# Patient Record
Sex: Male | Born: 1960
Health system: Southern US, Community
[De-identification: ages and names within clinical notes are randomized; demographics above are authoritative.]

## PROBLEM LIST (undated history)

## (undated) DIAGNOSIS — M722 Plantar fascial fibromatosis: Secondary | ICD-10-CM

## (undated) DIAGNOSIS — T8859XA Other complications of anesthesia, initial encounter: Secondary | ICD-10-CM

## (undated) DIAGNOSIS — T4145XA Adverse effect of unspecified anesthetic, initial encounter: Secondary | ICD-10-CM

## (undated) DIAGNOSIS — M1990 Unspecified osteoarthritis, unspecified site: Secondary | ICD-10-CM

## (undated) DIAGNOSIS — T7840XA Allergy, unspecified, initial encounter: Secondary | ICD-10-CM

## (undated) DIAGNOSIS — R112 Nausea with vomiting, unspecified: Secondary | ICD-10-CM

## (undated) DIAGNOSIS — Z9889 Other specified postprocedural states: Secondary | ICD-10-CM

## (undated) DIAGNOSIS — I10 Essential (primary) hypertension: Secondary | ICD-10-CM

## (undated) DIAGNOSIS — Z87442 Personal history of urinary calculi: Secondary | ICD-10-CM

## (undated) DIAGNOSIS — N200 Calculus of kidney: Secondary | ICD-10-CM

## (undated) HISTORY — DX: Allergy, unspecified, initial encounter: T78.40XA

## (undated) HISTORY — DX: Unspecified osteoarthritis, unspecified site: M19.90

## (undated) HISTORY — DX: Essential (primary) hypertension: I10

## (undated) HISTORY — DX: Calculus of kidney: N20.0

## (undated) HISTORY — DX: Plantar fascial fibromatosis: M72.2

---
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
## 1989-05-05 HISTORY — PX: APPENDECTOMY: SHX54

## 1996-05-05 HISTORY — PX: TESTICLE SURGERY: SHX794

## 1996-05-05 HISTORY — PX: VARICOCELECTOMY: SHX1084

## 2000-05-05 HISTORY — PX: TENDON REPAIR: SHX5111

## 2002-05-05 HISTORY — PX: LIPOMA EXCISION: SHX5283

## 2010-05-05 DIAGNOSIS — N200 Calculus of kidney: Secondary | ICD-10-CM

## 2010-05-05 HISTORY — PX: KIDNEY STONE SURGERY: SHX686

## 2010-05-05 HISTORY — DX: Calculus of kidney: N20.0

## 2012-05-05 HISTORY — PX: COLONOSCOPY: SHX174

## 2012-12-14 ENCOUNTER — Encounter: Payer: Self-pay | Admitting: Gastroenterology

## 2012-12-23 ENCOUNTER — Ambulatory Visit (AMBULATORY_SURGERY_CENTER): Payer: BC Managed Care – PPO | Admitting: *Deleted

## 2012-12-23 ENCOUNTER — Telehealth: Payer: Self-pay | Admitting: *Deleted

## 2012-12-23 ENCOUNTER — Encounter: Payer: Self-pay | Admitting: Gastroenterology

## 2012-12-23 VITALS — Ht 67.0 in | Wt 164.8 lb

## 2012-12-23 DIAGNOSIS — Z1211 Encounter for screening for malignant neoplasm of colon: Secondary | ICD-10-CM

## 2012-12-23 NOTE — Telephone Encounter (Signed)
John: Pt says that when he had appendectomy in [place] in 1991, he was given [place] with his anesthesia.  He said he ended up on a ventilator for 8 hours after the surgery because he could not wake up.  He has had surgeries since being in the [place] with no problem.  He is shceduled for colonoscopy with Dr Arlyce Dice on 9/4.  If you want to talk to him; his phone number is:  [phone].  Is pt okay for [facility]?  Thanks, [employer]

## 2012-12-23 NOTE — Progress Notes (Signed)
Pt says that when he had appendectomy in [place] in 1991, he was give [place] with his anesthesia.  He said he ended up on a ventilator for 8 hours after the surgery.  Note will be sent to Cathlyn Parsons to call pt and talk to him about sedation and anesthesia.  Pt given sample of Suprep.

## 2012-12-24 NOTE — Telephone Encounter (Signed)
John Wall  He is OK for [employer],  [employer]

## 2012-12-24 NOTE — Telephone Encounter (Signed)
noted 

## 2013-01-06 ENCOUNTER — Encounter: Payer: Self-pay | Admitting: Gastroenterology

## 2013-01-06 ENCOUNTER — Ambulatory Visit (AMBULATORY_SURGERY_CENTER): Payer: BC Managed Care – PPO | Admitting: Gastroenterology

## 2013-01-06 VITALS — BP 120/75 | HR 56 | Temp 98.3°F | Resp 15 | Ht 67.0 in | Wt 164.0 lb

## 2013-01-06 DIAGNOSIS — Z1211 Encounter for screening for malignant neoplasm of colon: Secondary | ICD-10-CM

## 2013-01-06 MED ORDER — SODIUM CHLORIDE 0.9 % IV SOLN: 500.0000 mL | INTRAVENOUS | Status: DC

## 2013-01-06 NOTE — Patient Instructions (Signed)
YOU HAD AN ENDOSCOPIC PROCEDURE TODAY AT THE [facility]: Refer to the procedure report that was given to you for any specific questions about what was found during the examination.  If the procedure report does not answer your questions, please call your gastroenterologist to clarify.  If you requested that your care partner not be given the details of your procedure findings, then the procedure report has been included in a sealed envelope for you to review at your convenience later.  YOU SHOULD EXPECT: Some feelings of bloating in the abdomen. Passage of more gas than usual.  Walking can help get rid of the air that was put into your GI tract during the procedure and reduce the bloating. If you had a lower endoscopy (such as a colonoscopy or flexible sigmoidoscopy) you may notice spotting of blood in your stool or on the toilet paper. If you underwent a bowel prep for your procedure, then you may not have a normal bowel movement for a few days.  DIET: Your first meal following the procedure should be a light meal and then it is ok to progress to your normal diet.  A half-sandwich or bowl of soup is an example of a good first meal.  Heavy or fried foods are harder to digest and may make you feel nauseous or bloated.  Likewise meals heavy in dairy and vegetables can cause extra gas to form and this can also increase the bloating.  Drink plenty of fluids but you should avoid alcoholic beverages for 24 hours.  ACTIVITY: Your care partner should take you home directly after the procedure.  You should plan to take it easy, moving slowly for the rest of the day.  You can resume normal activity the day after the procedure however you should NOT DRIVE or use heavy machinery for 24 hours (because of the sedation medicines used during the test).    SYMPTOMS TO REPORT IMMEDIATELY: A gastroenterologist can be reached at any hour.  During normal business hours, 8:30 AM to 5:00 PM Monday through Friday,  call [phone].  After hours and on weekends, please call the GI answering service at [phone] who will take a message and have the physician on call contact you.   Following lower endoscopy (colonoscopy or flexible sigmoidoscopy):  Excessive amounts of blood in the stool  Significant tenderness or worsening of abdominal pains  Swelling of the abdomen that is new, acute  Fever of 100F or higher    FOLLOW UP: If any biopsies were taken you will be contacted by phone or by letter within the next 1-3 weeks.  Call your gastroenterologist if you have not heard about the biopsies in 3 weeks.  Our staff will call the home number listed on your records the next business day following your procedure to check on you and address any questions or concerns that you may have at that time regarding the information given to you following your procedure. This is a courtesy call and so if there is no answer at the home number and we have not heard from you through the emergency physician on call, we will assume that you have returned to your regular daily activities without incident.  SIGNATURES/CONFIDENTIALITY: You and/or your care partner have signed paperwork which will be entered into your electronic medical record.  These signatures attest to the fact that that the information above on your After Visit Summary has been reviewed and is understood.  Full responsibility of the confidentiality   of this discharge information lies with you and/or your care-partner.     

## 2013-01-06 NOTE — Op Note (Signed)
[facility] [address] [place], [address]   COLONOSCOPY PROCEDURE REPORT  PATIENT: John Wall, John Wall  MR#: [id number] BIRTHDATE: 1960-11-23 , 51  yrs. old GENDER: Male ENDOSCOPIST: Louis Meckel, MD REFERRED BY: PROCEDURE DATE:  01/06/2013 PROCEDURE:   Colonoscopy, diagnostic First Screening Colonoscopy - Avg.  risk and is 50 yrs.  old or older Yes.  Prior Negative Screening - Now for repeat screening. N/A  History of Adenoma - Now for follow-up colonoscopy & has been > or = to 3 yrs.  N/A ASA CLASS:   Class II INDICATIONS:average risk screening. MEDICATIONS: MAC sedation, administered by CRNA and propofol (Diprivan) 200mg  IV  DESCRIPTION OF PROCEDURE:   After the risks benefits and alternatives of the procedure were thoroughly explained, informed consent was obtained.  A digital rectal exam revealed no abnormalities of the rectum.   The LB CF-H180AL Loaner [id number] endoscope was introduced through the anus and advanced to the cecum, which was identified by both the appendix and ileocecal valve. No adverse events experienced.   The quality of the prep was excellent using Suprep  The instrument was then slowly withdrawn as the colon was fully examined.      COLON FINDINGS: A normal appearing cecum, ileocecal valve, and appendiceal orifice were identified.  The ascending, hepatic flexure, transverse, splenic flexure, descending, sigmoid colon and rectum appeared unremarkable.  No polyps or cancers were seen. Retroflexed views revealed no abnormalities. The time to cecum=1 minutes 08 seconds.  Withdrawal time=6 minutes 04 seconds.  The scope was withdrawn and the procedure completed. COMPLICATIONS: There were no complications.  ENDOSCOPIC IMPRESSION: Normal colon  RECOMMENDATIONS: Continue current colorectal screening recommendations for "routine risk" patients with a repeat colonoscopy in 10 years.   eSigned:  Louis Meckel, MD 01/06/2013 9:30  AM   cc:   PATIENT NAME:  John Wall, John Wall MR#: [id number]

## 2013-01-06 NOTE — Progress Notes (Signed)
Lidocaine-40mg IV prior to Propofol InductionPropofol given over incremental dosages 

## 2013-01-07 ENCOUNTER — Telehealth: Payer: Self-pay | Admitting: *Deleted

## 2013-01-07 NOTE — Telephone Encounter (Signed)
No answer, left message to call if questions or concerns. 

## 2013-02-03 ENCOUNTER — Encounter: Payer: Self-pay | Admitting: Cardiovascular Disease

## 2013-02-03 ENCOUNTER — Ambulatory Visit (INDEPENDENT_AMBULATORY_CARE_PROVIDER_SITE_OTHER): Payer: BC Managed Care – PPO | Admitting: Cardiovascular Disease

## 2013-02-03 VITALS — BP 132/80 | HR 68 | Ht 67.0 in | Wt 167.1 lb

## 2013-02-03 DIAGNOSIS — I10 Essential (primary) hypertension: Secondary | ICD-10-CM | POA: Insufficient documentation

## 2013-02-03 DIAGNOSIS — R011 Cardiac murmur, unspecified: Secondary | ICD-10-CM | POA: Insufficient documentation

## 2013-02-03 DIAGNOSIS — R079 Chest pain, unspecified: Secondary | ICD-10-CM | POA: Insufficient documentation

## 2013-02-03 NOTE — Assessment & Plan Note (Signed)
His cardiac exam is suggestive of mitral valve prolapse and one wonders whether his episode of exertional chest pain could have been related to choroidal rupture and acute MR. Have recommended that he have an echocardiogram.

## 2013-02-03 NOTE — Assessment & Plan Note (Signed)
Well controlled 

## 2013-02-03 NOTE — Patient Instructions (Signed)
Your physician has requested that you have an echocardiogram. Echocardiography is a painless test that uses sound waves to create images of your heart. It provides your doctor with information about the size and shape of your heart and how well your heart's chambers and valves are working. This procedure takes approximately one hour. There are no restrictions for this procedure.  Your physician has requested that you have en exercise stress test.  We will call you with the results of both tests.  Your physician recommends that you schedule a follow-up appointment in: AS NEEDED

## 2013-02-03 NOTE — Progress Notes (Signed)
Patient ID: Wess Baney, male   DOB: 1960/09/25, 52 y.o.   MRN: [id number]     Reason for office visit Exertional chest pain  Mr. Esiah is a physically active 52 year old gentleman who is originally from [place], [place]. He is a habitual runner. About 3 weeks ago he was running the usual track through the woods that he always enjoys. After running for about 20 minutes he felt sudden onset of severe left-sided chest pain. This was severe and made him stop exercising. After resting and then walking at a slow pace for 5-10 minutes the symptoms resolved. He began jogging again and within about 10 minutes the symptoms again recurred in a similar pattern. Again he stopped and the symptoms resolved. The same pattern repeated a third time. He curtailed physical activity for a few days thinking that maybe he had a respiratory infection. He did better for several days but then when he tried to jog again the symptoms recurred. Again he was able to jog for about 20 minutes until chest discomfort onset. He had just had a colonoscopy roughly 2 days before the initial episode. He has her exercise again and the symptoms have not returned although he has noticed that he has a tightness in his left chest if he takes a very deep breath and strains. It almost sounds like the discomfort is not pleuritic. It is clearly not as severe. He denies any problems with dyspnea, palpitations, dizziness, syncope, lower extremity edema, intermittent claudication or erectile dysfunction. His only cardiovascular risk factor it is mild and well treated systemic hypertension. His father has had coronary problems at the age of 57. He has a reasonably good cholesterol profile with a total cholesterol of 183, triglycerides of 178, HDL 43 and LDL of 104. He does not have diabetes mellitus. He does not smoke. His renal function is normal.  Allergies  Allergen Reactions  . Shrimp [Shellfish Allergy] Shortness Of Breath and Swelling  . Sulfa  Antibiotics Rash    Current Outpatient Prescriptions  Medication Sig Dispense Refill  . Alpha-Aminobutyric Acid POWD by Does not apply route 2 (two) times a week.      Marland Kitchen amLODipine (NORVASC) 5 MG tablet Take 5 mg by mouth daily.      . fluticasone (VERAMYST) 27.5 MCG/SPRAY nasal spray Place 2 sprays into the nose daily.      . Multiple Vitamin (MULTIVITAMIN) tablet Take 1 tablet by mouth daily.       No current facility-administered medications for this visit.    Past Medical History  Diagnosis Date  . Hypertension   . Plantar fasciitis     left foot  . Allergy   . Arthritis     hands  . Kidney stone 2012    Past Surgical History  Procedure Laterality Date  . Rhinoplasty  1977  . Appendectomy  1991  . Tendon repair Left 2002    ankle  . Testicle surgery  1998  . Lipoma excision  2004    abdomen  . Kidney stone surgery  2012    Family History  Problem Relation Age of Onset  . Colon cancer Neg Hx    He is married and has 2 children, the oldest is a marine the other is a 54 year old boy History   Social History  . Marital Status: Married    Spouse Name: N/A    Number of Children: N/A  . Years of Education: N/A   Occupational History  . Not on file.  Social History Main Topics  . Smoking status: Never Smoker   . Smokeless tobacco: Never Used  . Alcohol Use: 1.0 oz/week    2 drink(s) per week  . Drug Use: Not on file  . Sexual Activity: Not on file   Other Topics Concern  . Not on file   Social History Narrative  . No narrative on file    Review of systems: The patient specifically denies any dyspnea at rest or with exertion, orthopnea, paroxysmal nocturnal dyspnea, syncope, palpitations, focal neurological deficits, intermittent claudication, lower extremity edema, unexplained weight gain, cough, hemoptysis or wheezing.  The patient also denies abdominal pain, nausea, vomiting, dysphagia, diarrhea, constipation, polyuria, polydipsia, dysuria,  hematuria, frequency, urgency, abnormal bleeding or bruising, fever, chills, unexpected weight changes, mood swings, change in skin or hair texture, change in voice quality, auditory or visual problems, allergic reactions or rashes, new musculoskeletal complaints other than usual "aches and pains".   PHYSICAL EXAM BP 132/80  Pulse 68  Ht 5\' 7"  (1.702 m)  Wt 167 lb 1.6 oz (75.796 kg)  BMI 26.17 kg/m2  General: Alert, oriented x3, no distress; he looks younger than his stated age and appears fitand relatively athletic Head: no evidence of trauma, PERRL, EOMI, no exophtalmos or lid lag, no myxedema, no xanthelasma; normal ears, nose and oropharynx Neck: normal jugular venous pulsations and no hepatojugular reflux; brisk carotid pulses without delay and no carotid bruits Chest: clear to auscultation, no signs of consolidation by percussion or palpation, normal fremitus, symmetrical and full respiratory excursions Cardiovascular: normal position and quality of the apical impulse, regular rhythm, normal first and second heart sounds, no murmurs, rubs or gallops. He has a grade 1-2/6 systolic murmur heard best roughly 4 cm to the left of the left sternal border it disappears with bilateral hand grip. With a Valsalva maneuver a distinct systolic click develops but it is hard to say that the murmur intensifies. Abdomen: no tenderness or distention, no masses by palpation, no abnormal pulsatility or arterial bruits, normal bowel sounds, no hepatosplenomegaly Extremities: no clubbing, cyanosis or edema; 2+ radial, ulnar and brachial pulses bilaterally; 2+ right femoral, posterior tibial and dorsalis pedis pulses; 2+ left femoral, posterior tibial and dorsalis pedis pulses; no subclavian or femoral bruits Neurological: grossly nonfocal   EKG: NSR, normal  Lipid Panel  total cholesterol of 183, triglycerides of 178, HDL 43 and LDL of 104   ASSESSMENT AND PLAN Chest pain The initial episode 3 weeks ago  is strongly suggestive of exertional angina pectoris, but subsequently symptoms have settled into a more pleuritic pattern. His resting echocardiogram is completely normal and he feels better. I've recommended that he have a treadmill stress test. If this is abnormal I would proceed directly to coronary angiography.  Murmur His cardiac exam is suggestive of mitral valve prolapse and one wonders whether his episode of exertional chest pain could have been related to choroidal rupture and acute MR. Have recommended that he have an echocardiogram.  HTN (hypertension) Well-controlled.   Orders Placed This Encounter  Procedures  . EKG 12-Lead  . Exercise tolerance test  . 2D Echocardiogram without contrast   Meds ordered this encounter  Medications  . fluticasone (VERAMYST) 27.5 MCG/SPRAY nasal spray    Sig: Place 2 sprays into the nose daily.  . Alpha-Aminobutyric Acid POWD    Sig: by Does not apply route 2 (two) times a week.    Junious Silk, MD, [facility] [phone] office (  [phone] pager

## 2013-02-03 NOTE — Assessment & Plan Note (Signed)
The initial episode 3 weeks ago is strongly suggestive of exertional angina pectoris, but subsequently symptoms have settled into a more pleuritic pattern. His resting echocardiogram is completely normal and he feels better. I've recommended that he have a treadmill stress test. If this is abnormal I would proceed directly to coronary angiography.

## 2013-02-07 ENCOUNTER — Encounter: Payer: Self-pay | Admitting: Cardiovascular Disease

## 2013-02-10 ENCOUNTER — Encounter (HOSPITAL_COMMUNITY): Payer: BC Managed Care – PPO

## 2013-02-15 ENCOUNTER — Telehealth (HOSPITAL_COMMUNITY): Payer: Self-pay | Admitting: *Deleted

## 2013-02-22 ENCOUNTER — Ambulatory Visit (HOSPITAL_COMMUNITY): Payer: BC Managed Care – PPO

## 2013-07-14 ENCOUNTER — Ambulatory Visit (HOSPITAL_COMMUNITY)
Admission: RE | Admit: 2013-07-14 | Discharge: 2013-07-14 | Disposition: A | Discharge status: Home / Self Care | Payer: BC Managed Care – PPO | Source: Ambulatory Visit | Attending: Chiropractic Medicine | Admitting: Chiropractic Medicine

## 2013-07-14 ENCOUNTER — Other Ambulatory Visit (HOSPITAL_COMMUNITY): Payer: Self-pay | Admitting: Chiropractic Medicine

## 2013-07-14 DIAGNOSIS — M542 Cervicalgia: Secondary | ICD-10-CM | POA: Diagnosis present

## 2013-07-14 DIAGNOSIS — M549 Dorsalgia, unspecified: Secondary | ICD-10-CM | POA: Insufficient documentation

## 2013-07-14 DIAGNOSIS — R52 Pain, unspecified: Secondary | ICD-10-CM

## 2013-08-23 ENCOUNTER — Ambulatory Visit (INDEPENDENT_AMBULATORY_CARE_PROVIDER_SITE_OTHER): Payer: BC Managed Care – PPO | Admitting: Emergency Medicine

## 2013-08-23 VITALS — BP 130/90 | HR 88 | Temp 98.1°F | Resp 16 | Ht 65.5 in | Wt 170.8 lb

## 2013-08-23 DIAGNOSIS — I10 Essential (primary) hypertension: Secondary | ICD-10-CM

## 2013-08-23 LAB — POCT URINALYSIS DIPSTICK
Bilirubin, UA: NEGATIVE
GLUCOSE UA: NEGATIVE
Ketones, UA: NEGATIVE
LEUKOCYTES UA: NEGATIVE
Nitrite, UA: NEGATIVE
PROTEIN UA: NEGATIVE
Spec Grav, UA: 1.02
UROBILINOGEN UA: 0.2
pH, UA: 7

## 2013-08-23 LAB — POCT UA - MICROSCOPIC ONLY
Bacteria, U Microscopic: NEGATIVE
CRYSTALS, UR, HPF, POC: NEGATIVE
Casts, Ur, LPF, POC: NEGATIVE
Epithelial cells, urine per micros: NEGATIVE
WBC, Ur, HPF, POC: NEGATIVE
Yeast, UA: NEGATIVE

## 2013-08-23 MED ORDER — MELOXICAM 15 MG PO TABS: 15.0000 mg | ORAL_TABLET | Freq: Every day | ORAL | Status: DC

## 2013-08-23 MED ORDER — FLUTICASONE FUROATE 27.5 MCG/SPRAY NA SUSP: 2.0000 | Freq: Every day | NASAL | Status: DC

## 2013-08-23 MED ORDER — AMLODIPINE BESYLATE 5 MG PO TABS: 5.0000 mg | ORAL_TABLET | Freq: Every day | ORAL | Status: DC

## 2013-08-23 NOTE — Progress Notes (Signed)
[facility] [address], [place] [address] [phone]  Date:  08/23/2013   Name:  John Wall   DOB:  July 23, 1960   MRN:  [id number]  PCP:  Pcp Not In System    Chief Complaint: Establish Care, Medication Refill and Allergic Rhinitis    History of Present Illness:  John Wall is a 53 y.o. very pleasant male patient who presents with the following:  History of hypertension.  Out of medications past few days.  Non smoker.  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 Denies other complaint or health concern today.  Had colonoscopy last year.    Patient Active Problem List   Diagnosis Date Noted  . HTN (hypertension) 02/03/2013  . Chest pain 02/03/2013  . Murmur 02/03/2013    Past Medical History  Diagnosis Date  . Hypertension   . Plantar fasciitis     left foot  . Allergy   . Arthritis     hands  . Kidney stone 2012    Past Surgical History  Procedure Laterality Date  . Rhinoplasty  1977  . Appendectomy  1991  . Tendon repair Left 2002    ankle  . Testicle surgery  1998  . Lipoma excision  2004    abdomen  . Kidney stone surgery  2012    History  Substance Use Topics  . Smoking status: Never Smoker   . Smokeless tobacco: Never Used  . Alcohol Use: 1.0 oz/week    2 drink(s) per week    Family History  Problem Relation Age of Onset  . Colon cancer Neg Hx   . Heart attack Father     Allergies  Allergen Reactions  . Shrimp [Shellfish Allergy] Shortness Of Breath and Swelling  . Sulfa Antibiotics Rash    Medication list has been reviewed and updated.  Current Outpatient Prescriptions on File Prior to Visit  Medication Sig Dispense Refill  . Alpha-Aminobutyric Acid POWD by Does not apply route 2 (two) times a week.      Marland Kitchen amLODipine (NORVASC) 5 MG tablet Take 5 mg by mouth daily.      . Multiple Vitamin (MULTIVITAMIN) tablet Take 1 tablet by mouth daily.      .  fluticasone (VERAMYST) 27.5 MCG/SPRAY nasal spray Place 2 sprays into the nose daily.       No current facility-administered medications on file prior to visit.    Review of Systems:  As per HPI, otherwise negative.    Physical Examination: Filed Vitals:   08/23/13 1639  BP: 130/90  Pulse: 88  Temp: 98.1 F (36.7 C)  Resp: 16   Filed Vitals:   08/23/13 1639  Height: 5' 5.5" (1.664 m)  Weight: 170 lb 12.8 oz (77.474 kg)   Body mass index is 27.98 kg/(m^2). Ideal Body Weight: Weight in (lb) to have BMI = 25: 152.2  GEN: WDWN, NAD, Non-toxic, A & O x 3 HEENT: Atraumatic, Normocephalic. Neck supple. No masses, No LAD. Ears and Nose: No external deformity. CV: RRR, No M/G/R. No JVD. No thrill. No extra heart sounds. PULM: CTA B, no wheezes, crackles, rhonchi. No retractions. No resp. distress. No accessory muscle use. ABD: S, NT, ND, +BS. No rebound. No HSM. EXTR: No c/c/e NEURO Normal gait.  PSYCH: Normally interactive. Conversant. Not depressed or anxious appearing.  Calm demeanor.    Assessment and Plan: Hypertension Future labs  Signed,  Ellison Carwin,  MD

## 2013-08-23 NOTE — Addendum Note (Signed)
Addended by: Roselee Culver on: 08/23/2013 05:46 PM   Modules accepted: Orders

## 2013-08-23 NOTE — Patient Instructions (Signed)
Hipertensin (Hypertension) Cuando el corazn late [facility] sangre a travs de las arterias. La fuerza que se origina es la presin arterial. Si la presin es demasiado elevada, se denomina hipertensin. El peligro radica en que puede sufrirla y no saberlo. Hipertensin puede significar que su corazn debe trabajar ms intensamente para bombear sangre. Las arterias pueden Insurance risk surveyor o rgidas. [place] extra [place] el riesgo de enfermedades cardacas, ictus y [employer].  La presin arterial est formada por dos nmeros: el nmero mayor sobre el nmero menor, por ejemplo110/70. Se seala "110/72". Los valores ideales son por debajo de 120 para el nmero ms alto (sistlica) y por debajo de 80 para el ms bajo (diastlica). Anote su presin sangunea hoy. Debe prestar mucha atencin a su presin arterial si sufre alguna otra enfermedad como:  Insuficiencia cardaca  Ataques cardiacos previos  Diabetes  Enfermedad renal crnica  Ictus previo  Mltiples factores de riesgo para enfermedades cardacas Para diagnosticar si usted sufre hipertensin arterial, debe medirse la presin mientras encuentra sentado con el brazo elevado a la altura del nivel del corazn. Debe medirse al menos 2 veces. Una nica lectura de presin arterial elevada (especialmente en el servicio de emergencias) no significa que necesita tratamiento. Hay enfermedades en las que la presin arterial es diferente en ambos brazos. Es importante que consulte rpidamente con su mdico para un control. La [employer] de las personas sufren hipertensin esencial, lo que significa que no tiene una causa especfica. Este tipo de hipertensin puede bajarse modificando algunos factores en el estilo de vida como:  Psychologist, forensic.  El consumo de cigarrillos.  La falta de actividad fsica.  Peso excesivo  Consumo de drogas y alcohol.  Consumiendo menos sal La mayora de las personas no tienen sntomas hasta que la hipertensin  ocasiona un dao en el organismo. El tratamiento efectivo puede evitar, Network engineer o reducir ese dao. TRATAMIENTO: El tratamiento para la hipertensin, cuando se ha identificado una causa, est dirigido a la misma Hay un gran nmero en medicamentos para tratarla. Se agrupan en diferentes categoras y Office manager los medicamentos indicados para usted. Muchos medicamentos disponibles tienen efectos secundarios. Debe comentar los efectos secundarios con su mdico. Si la presin arterial permanece elevada despus de modificar su estilo de vida o comenzar a tomar medicamentos:  Los medicamentos deben ser reemplazados  Puede ser necesario evaluar otros problemas.  Debe estar seguro que comprende las indicaciones, que sabe cmo y cundo [employer].  Asegrese de Optometrist un control con su mdico dentro del tiempo indicado (generalmente dentro de las [employer]) para volver a Mudlogger presin arterial y Health visitor los medicamentos prescritos.  Si est tomando ms de un medicamento para la presin arterial, asegrese que sabe cmo y en qu momentos debe tomarlos. Tomar los medicamentos al mismo tiempo puede dar como resultado un gran descenso en la presin arterial. SOLICITE ATENCIN MDICA DE INMEDIATO SI PRESENTA:  Dolor de cabeza intenso, visin borrosa o cambios en la visin, o confusin.  Debilidad o adormecimientos inusuales o sensacin de desmayo.  Dolor de pecho o abdominal intenso, vmitos o problemas para respirar. ASEGRESE QUE:   Comprende estas instrucciones.  Controlar su enfermedad.  Solicitar ayuda inmediatamente si no mejora o si empeora. Document Released: 04/21/2005 Document Revised: 07/14/2011 [facility] Patient Information 2014 [place], [place]. Alergias (Allergies) El profesional que lo asiste le ha diagnosticado que usted padece de [place]. Las [employer] pueden ser ocasionadas por cualquier cosa a la que su organismo es sensible. Pueden  ser  alimentos, medicamentos, polen, sustancias qumicas y casi cualquiera de las cosas que lo rodean en su vida diaria que producen alrgenos. Un alrgeno es todo lo que hace que una sustancia produzca alergia. La herencia es uno de los factores que causa este problema. Esto significa que usted puede sufrir alguna de las alergias que sufrieron sus [place]. Las [employer] a la comida pueden ocurrir a Actuary. Estn entre las ms graves y Engineering geologist en peligro la vida. Algunos de los alimentos que comnmente producen [place] son la [place] de [place], los frutos de mar, los Octa, los frutos secos, el trigo y la soja. SNTOMAS  Hinchazn alrededor de la boca.  Una erupcin roja que produce picazn o urticaria.  Vmitos o diarrea.  Dificultad para respirar. LAS REACCIONES ALRGICAS GRAVES PONEN EN PELIGRO LA VIDA . Esta reaccin se denomina anafilaxis. Puede ocasionar que la boca y la garganta se hinchen y produzca dificultad para respirar y Engineer, manufacturing. En reacciones graves, slo una pequea cantidad del alimento (por ejemplo, aceite de cacahuate en la ensalada) puede producir la muerte en pocos segundos. Las [employer] pueden ocurrir a Actuary. Se denominan as porque generalmente se producen durante la misma estacin todos los aos. Puede ser Neomia Dear reaccin al moho, al polen del csped o al polen de los rboles. Otras causas del problema son los alrgenos que contienen los caros del polvo del hogar, el pelaje de las mascotas y las esporas del moho. Los sntomas consisten en congestin nasal, picazn y secrecin nasal asociada con estornudos, y lagrimeo y [employer] ojos. Tambin puede haber picazn de la boca y los odos. Estos problemas aparecen cuando se entra en contacto con el polen y otros alrgenos. Los alrgenos son las partculas que estn en el aire y a las que el organismo reacciona cuando existe una Automotive engineer. Esto hace que usted libere anticuerpos alrgicos. A travs de  una cadena de eventos, estos finalmente hacen que usted libere histamina en la corriente sangunea. Aunque esto implica una proteccin para su organismo, es lo que le produce disconfort. Ese es el motivo por el que se le han indicado antihistamnicos para sentirse mejor. Si usted no Counselling psychologist cul es el alrgeno que le produjo la reaccin, puede someterse a una prueba de [place] o de piel. Las alergias no pueden curarse pero pueden controlarse con medicamentos. La fiebre de heno es un grupo de trastornos alrgicos estacionales Simplemente se tratan con medicamentos de venta libre como difenhidramina (Benadryl). Tome los medicamentos segn las indicaciones. No consuma alcohol ni conduzca mientras toma este medicamento. Consulte con el profesional que lo asiste o siga las instrucciones de uso para las dosis para nios. Si estos medicamentos no le Merchant navy officer, existen muchos otros nuevos que el profesional que lo asiste puede prescribirle. Podrn utilizarse medicamentos ms fuertes tales como un spray nasal, colirios y corticoides si los primeros medicamentos que prueba no lo [place]. Si todos estos fracasan, puede Chemical engineer otros tratamientos como la inmunoterapia o las inyecciones desensibilizantes. Haga una consulta de seguimiento con el profesional que lo asiste si los problemas continan. Estas alergias estacionales no ponen en peligro la vida. Generalmente se trata de una incomodidad que puede aliviarse con medicamentos. INSTRUCCIONES PARA EL CUIDADO DOMICILIARIO  Si no est seguro de que es lo que le produce la reaccin, Audiological scientist un registro de los alimentos que come y los sntomas que le siguen. Evite los Personal assistant.  Si presenta urticaria o una erupcin  cutnea:  Tome los medicamentos como se le indic.  Puede utilizar un antihistamnico de venta libre (difenhidramina) para la urticaria y Cabin crew, segn sea necesario.  Aplquese compresas sobre la piel o tome  baos de agua fra. Evite los [place] calientes. El calor puede hacer que la urticaria y la picazn empeoren.  Si usted es muy alrgico:  Como consecuencia de un tratamiento para una reaccin grave, puede necesitar ser hospitalizado para recibir un seguimiento intensivo.  Utilice un brazalete o collar de alerta mdico, indicando que usted es Air cabin crew.  Usted y su familia deben aprender a Architectural technologist adrenalina o a Risk manager un kit anafilctico.  Si usted ya ha sufrido una reaccin grave, siempre lleve el kit anafilctico o el EpiPen con usted. Si sufre una reaccin grave, utilice esta medicacin del modo en que se lo indic el profesional que lo asiste. Una falla puede conllevar consecuencias fatales. SOLICITE ATENCIN MDICA SI:  Sospecha que puede sufrir una alergia a algn alimento. Los sntomas generalmente ocurren dentro de los 30 minutos posteriores a haber ingerido el alimento.  Los sntomas persistieron durante 2 das o han empeorado.  Desarrolla nuevos sntomas.  Quiere volver a probar o que su hijo consuma nuevamente un alimento o bebida que usted cree que le causa una reaccin IT consultant. Nunca lo haga si ha sufrido una reaccin anafilctica a ese alimento o a esa bebida con anterioridad. Slo intntelo bajo la supervisin del mdico. SOLICITE ATENCIN MDICA DE INMEDIATO SI:  Presenta dificultad para respirar, jadea o tiene una sensacin de opresin en el pecho o en la garganta.  Tiene la boca hinchada, o presenta urticaria, hinchazn o picazn en todo el cuerpo.  Ha sufrido una reaccin grave que ha respondido a Radiation protection practitioner o al EpiPen. Estas reacciones pueden volver a presentarse cuando haya terminado la medicacin. Estas reacciones deben considerarse como que ponen en peligro la vida. EST SEGURO QUE:   Comprende las instrucciones para el alta mdica.  Controlar su enfermedad.  Solicitar atencin mdica de inmediato segn las  indicaciones. Document Released: 04/21/2005 Document Revised: 08/16/2012 [facility] Patient Information 2014 [place], [place].

## 2013-08-24 NOTE — Progress Notes (Signed)
Left a message for patient to return call for appointment with Dr. Greene 

## 2013-08-27 ENCOUNTER — Other Ambulatory Visit (INDEPENDENT_AMBULATORY_CARE_PROVIDER_SITE_OTHER): Payer: BC Managed Care – PPO | Admitting: *Deleted

## 2013-08-27 DIAGNOSIS — I10 Essential (primary) hypertension: Secondary | ICD-10-CM

## 2013-08-27 LAB — COMPREHENSIVE METABOLIC PANEL
ALBUMIN: 4.4 g/dL (ref 3.5–5.2)
ALT: 32 U/L (ref 0–53)
AST: 29 U/L (ref 0–37)
Alkaline Phosphatase: 75 U/L (ref 39–117)
BUN: 14 mg/dL (ref 6–23)
CO2: 25 mEq/L (ref 19–32)
Calcium: 9 mg/dL (ref 8.4–10.5)
Chloride: 100 mEq/L (ref 96–112)
Creat: 0.78 mg/dL (ref 0.50–1.35)
Glucose, Bld: 94 mg/dL (ref 70–99)
POTASSIUM: 4 meq/L (ref 3.5–5.3)
SODIUM: 138 meq/L (ref 135–145)
TOTAL PROTEIN: 6.8 g/dL (ref 6.0–8.3)
Total Bilirubin: 0.8 mg/dL (ref 0.2–1.2)

## 2013-08-27 LAB — LIPID PANEL
Cholesterol: 190 mg/dL (ref 0–200)
HDL: 38 mg/dL — ABNORMAL LOW (ref >39)
LDL CALC: 126 mg/dL — AB (ref 0–99)
Total CHOL/HDL Ratio: 5 Ratio
Triglycerides: 129 mg/dL (ref <150)
VLDL: 26 mg/dL (ref 0–40)

## 2013-08-27 LAB — CBC WITH DIFFERENTIAL/PLATELET
BASOS ABS: 0 10*3/uL (ref 0.0–0.1)
BASOS PCT: 0 % (ref 0–1)
Eosinophils Absolute: 0.4 10*3/uL (ref 0.0–0.7)
Eosinophils Relative: 6 % — ABNORMAL HIGH (ref 0–5)
HCT: 46.6 % (ref 39.0–52.0)
Hemoglobin: 16.4 g/dL (ref 13.0–17.0)
LYMPHS PCT: 20 % (ref 12–46)
Lymphs Abs: 1.4 10*3/uL (ref 0.7–4.0)
MCH: 30.4 pg (ref 26.0–34.0)
MCHC: 35.2 g/dL (ref 30.0–36.0)
MCV: 86.5 fL (ref 78.0–100.0)
Monocytes Absolute: 0.4 10*3/uL (ref 0.1–1.0)
Monocytes Relative: 6 % (ref 3–12)
NEUTROS ABS: 4.8 10*3/uL (ref 1.7–7.7)
Neutrophils Relative %: 68 % (ref 43–77)
Platelets: 303 10*3/uL (ref 150–400)
RBC: 5.39 MIL/uL (ref 4.22–5.81)
RDW: 14.1 % (ref 11.5–15.5)
WBC: 7 10*3/uL (ref 4.0–10.5)

## 2013-08-27 LAB — TSH: TSH: 1.437 u[IU]/mL (ref 0.350–4.500)

## 2013-08-29 LAB — PSA: PSA: 0.64 ng/mL (ref <=4.00)

## 2013-09-07 NOTE — Progress Notes (Signed)
Left another message for patient.

## 2013-09-14 ENCOUNTER — Ambulatory Visit (INDEPENDENT_AMBULATORY_CARE_PROVIDER_SITE_OTHER): Payer: BC Managed Care – PPO | Admitting: Family Medicine

## 2013-09-14 VITALS — BP 140/80 | HR 75 | Temp 98.3°F | Resp 18 | Ht 66.0 in | Wt 170.0 lb

## 2013-09-14 DIAGNOSIS — M1990 Unspecified osteoarthritis, unspecified site: Secondary | ICD-10-CM

## 2013-09-14 DIAGNOSIS — M5430 Sciatica, unspecified side: Secondary | ICD-10-CM

## 2013-09-14 MED ORDER — DICLOFENAC SODIUM 1 % TD GEL: 2.0000 g | Freq: Three times a day (TID) | TRANSDERMAL | Status: DC

## 2013-09-14 MED ORDER — HYDROCODONE-ACETAMINOPHEN 5-325 MG PO TABS
1.0000 | ORAL_TABLET | Freq: Every evening | ORAL | Status: DC | PRN
Start: 2013-09-14 — End: 2014-07-08

## 2013-09-14 MED ORDER — METHYLPREDNISOLONE ACETATE 80 MG/ML IJ SUSP
120.0000 mg | Freq: Once | INTRAMUSCULAR | Status: AC
  Administered 2013-09-14: 120 mg via INTRAMUSCULAR

## 2013-09-14 NOTE — Patient Instructions (Signed)
Ciática  °(Sciatica) ° La ciática es el dolor, debilidad, entumecimiento u hormigueo a lo largo del nervio ciático. El nervio comienza en la zona inferior de la espalda y desciende por la parte posterior de cada pierna. El nervio controla los músculos de la parte inferior de la pierna y de la zona posterior de la rodilla, y transmite la sensibilidad a la parte posterior del muslo, la pierna y la planta del pie. La ciática es un síntoma de otras afecciones médicas. Por ejemplo, un daño a los nervios o algunas enfermedades como un disco herniado o un espolón óseo en la columna vertebral, podrían dañarle o presionar en el nervio ciático. Esto causa dolor, debilidad y otras sensaciones normalmente asociadas con la ciática. Generalmente la ciática afecta sólo un lado del cuerpo. °CAUSAS  °· Disco herniado o desplazado. °· Enfermedad degenerativa del disco. °· Un síndrome doloroso que compromete un músculo angosto de los glúteos (síndrome piriforme). °· Lesión o fractura pélvica. °· Embarazo. °· Tumor (casos raros). °SÍNTOMAS  °Los síntomas pueden variar de leves a muy graves. Por lo general, los síntomas descienden desde la zona lumbar a las nalgas y la parte posterior de la pierna. Ellos son:  °· Hormigueo leve o dolor sordo en la parte inferior de la espalda, la pierna o la cadera. °· Adormecimiento en la parte posterior de la pantorrilla o la planta del pie. °· Sensación de quemazón en la zona lumbar, la pierna o la cadera. °· Dolor agudo en la zona inferior de la espalda, la pierna o la cadera. °· Debilidad en las piernas. °· Dolor de espalda intenso que inhibe los movimientos. °Los síntomas pueden empeorar al toser, estornudar, reír o estar sentado o parado durante mucho tiempo. Además, el sobrepeso puede empeorar los síntomas.  °DIAGNÓSTICO  °Su médico le hará un examen físico para buscar los síntomas comunes de la ciática. Le pedirá que haga algunos movimientos o actividades que activarían el dolor del nervio  ciático. Para encontrar las causas de la ciática podrá indicarle otros estudios. Estos pueden ser:  °· Análisis de sangre. °· Radiografías. °· Pruebas de diagnóstico por imágenes, como resonancia magnética o tomografía computada. °TRATAMIENTO  °El tratamiento se dirige a las causas de la ciática. A veces, el tratamiento no es necesario, y el dolor y el malestar desaparecen por sí mismos. Si necesita tratamiento, su médico puede sugerir:  °· Medicamentos de venta libre para aliviar el dolor. °· Medicamentos recetados, como antiinflamatorios, relajantes musculares o narcóticos. °· Aplicación de calor o hielo en la zona del dolor. °· Inyecciones de corticoides para disminuir el dolor, la irritación y la inflamación alrededor del nervio. °· Reducción de la actividad en los períodos de dolor. °· Ejercicios y estiramiento del abdomen para fortalecer y mejorar la flexibilidad de la columna vertebral. Su médico puede sugerirle perder peso si el peso extra empeora el dolor de espalda. °· Fisioterapia. °· La cirugía para eliminar lo que presiona o pincha el nervio, como un espolón óseo o parte de una hernia de disco. °INSTRUCCIONES PARA EL CUIDADO EN EL HOGAR  °· Sólo tome medicamentos de venta libre o recetados para calmar el dolor o el malestar, según las indicaciones de su médico. °· Aplique hielo sobre el área dolorida durante 20 minutos 3-4 veces por día durante los primeras 48-72 horas. Luego intente aplicar calor de la misma manera. °· Haga ejercicios, elongue o realice sus actividades habituales, si no le causan más dolor. °· Cumpla con todas las sesiones de fisioterapia, según le   primeras 48-72 horas. Luego intente aplicar calor de la misma manera.  · Haga ejercicios, elongue o realice sus actividades habituales, si no le causan más dolor.  · Cumpla con todas las sesiones de fisioterapia, según le indique su médico.  · Cumpla con todas las visitas de control, según le indique su médico.  · No use tacones altos o zapatos que no tengan buen apoyo.  · Verifique que el colchón no sea muy blando. Un colchón firme aliviará el dolor y las molestias.  SOLICITE ATENCIÓN MÉDICA DE INMEDIATO SI:   · Pierde el control de la vejiga o del intestino  (incontinencia).  · Aumenta la debilidad en la zona inferior de la espalda, la pelvis, las nalgas o las piernas.  · Siente irritación o inflamación en la espalda.  · Tiene sensación de ardor al orinar.  · El dolor empeora cuando se acuesta o lo despierta por la noche.  · El dolor es peor del que experimentó en el pasado.  · Dura más de 4 semanas.  · Pierde peso sin motivo de manera súbita.  ASEGÚRESE DE QUE:   · Comprende estas instrucciones.  · Controlará su enfermedad.  · Solicitará ayuda de inmediato si no mejora o si empeora.  Document Released: 04/21/2005 Document Revised: 10/21/2011  ExitCare® Patient Information ©2014 [employer].

## 2013-09-14 NOTE — Progress Notes (Signed)
This chart was scribed for Robyn Haber, MD by Vernell Barrier, Medical Scribe. This patient's care was started at 7:00 PM  This is a 53 y.o.male who complains of low back pain onset 3 day ago. Pt states he was cleaning bent over all day 3 days ago and went to stand up and upon attempting to stand up, an acute pain in the lower back presented with radiation into the right gluteal. Able to ambulate but with pain. Has tried ibuprofen for pain with little relief.   Character of pain: aching Location of pain:  Low back Radiation of pain:  Right gluteal area Onset associated with: Heavy lifting Patient has not had a past history of low back pain.  Kingsten Enfield denies any urinary symptoms, bowel problems, numbness in the legs, loss of motor power. [place] had no fever.  [place] Keziah has tried ibuprofen.  Also presents with pain and swelling in the right fingers and right ulnar wrist which he has been told is osteoarthritis. Seen specialist before and got injection which he states provided some relief but did not sustain. Cannot take Meloxicam because it upsets stomach.   Past Medical History  Diagnosis Date   Hypertension    Plantar fasciitis     left foot   Allergy    Arthritis     hands   Kidney stone 2012     Past Surgical History  Procedure Laterality Date   Rhinoplasty  1977   Appendectomy  1991   Tendon repair Left 2002    ankle   Testicle surgery  1998   Lipoma excision  2004    abdomen   Kidney stone surgery  2012    Objective:  middle-aged male in no acute distress. Blood pressure 140/80, pulse 75, temperature 98.3 F (36.8 C), temperature source Oral, resp. rate 18, height 5\' 6"  (1.676 m), weight 170 lb (77.111 kg), SpO2 99.00%.Body mass index is 27.45 kg/(m^2). Palpation of the back reveals nontender CVA:  nontender Abdomen: soft, nontender Peripheral pulses:  DP/PT 2+ Inspection of the back: Reveals no scoliosis Straight-leg raising: pain with  right elevation Motor exam of lower extremity: No abnormal weakness. Reflexes: Symmetric and normal Skin exam: normal  Assessment/Plan: Acute lower back pain without acute neurological findings.  Sciatic neuralgia - Plan: HYDROcodone-acetaminophen (NORCO) 5-325 MG per tablet, methylPREDNISolone acetate (DEPO-MEDROL) injection 120 mg  Osteoarthritis - Plan: diclofenac sodium (VOLTAREN) 1 % GEL, methylPREDNISolone acetate (DEPO-MEDROL) injection 120 mg  Signed, Robyn Haber, MD

## 2013-09-16 ENCOUNTER — Telehealth: Payer: Self-pay

## 2013-09-16 NOTE — Telephone Encounter (Signed)
Pt called in regards to prescription Voltaren, pharmacy is still waiting on authorization.

## 2013-09-16 NOTE — Telephone Encounter (Signed)
I have not gotten any fax about needing a PA. Called and asked pharmacy to send PA info.

## 2013-09-20 NOTE — Telephone Encounter (Signed)
Still did not get info from pharm but was able to complete PA form covermymeds using info from our copy of ins card. Asked for expedited review.

## 2013-09-22 NOTE — Telephone Encounter (Signed)
PA approved through 05/04/2038. Notified pt and pharm on VM.

## 2014-02-20 ENCOUNTER — Other Ambulatory Visit: Payer: Self-pay | Admitting: Emergency Medicine

## 2014-04-23 ENCOUNTER — Other Ambulatory Visit: Payer: Self-pay | Admitting: Emergency Medicine

## 2014-06-30 ENCOUNTER — Other Ambulatory Visit: Payer: Self-pay | Admitting: Physician Assistant

## 2014-07-08 ENCOUNTER — Ambulatory Visit (INDEPENDENT_AMBULATORY_CARE_PROVIDER_SITE_OTHER): Payer: BLUE CROSS/BLUE SHIELD | Admitting: Internal Medicine

## 2014-07-08 VITALS — BP 130/76 | HR 68 | Temp 98.6°F | Ht 66.0 in | Wt 168.1 lb

## 2014-07-08 DIAGNOSIS — R011 Cardiac murmur, unspecified: Secondary | ICD-10-CM | POA: Diagnosis not present

## 2014-07-08 DIAGNOSIS — M7701 Medial epicondylitis, right elbow: Secondary | ICD-10-CM

## 2014-07-08 DIAGNOSIS — Z0000 Encounter for general adult medical examination without abnormal findings: Secondary | ICD-10-CM

## 2014-07-08 DIAGNOSIS — Z23 Encounter for immunization: Secondary | ICD-10-CM | POA: Diagnosis not present

## 2014-07-08 DIAGNOSIS — I10 Essential (primary) hypertension: Secondary | ICD-10-CM

## 2014-07-08 LAB — COMPREHENSIVE METABOLIC PANEL
ALT: 38 U/L (ref 0–53)
AST: 33 U/L (ref 0–37)
Albumin: 4.6 g/dL (ref 3.5–5.2)
Alkaline Phosphatase: 79 U/L (ref 39–117)
BILIRUBIN TOTAL: 0.8 mg/dL (ref 0.2–1.2)
BUN: 12 mg/dL (ref 6–23)
CO2: 26 mEq/L (ref 19–32)
Calcium: 9.5 mg/dL (ref 8.4–10.5)
Chloride: 101 mEq/L (ref 96–112)
Creat: 0.77 mg/dL (ref 0.50–1.35)
Glucose, Bld: 86 mg/dL (ref 70–99)
POTASSIUM: 3.8 meq/L (ref 3.5–5.3)
Sodium: 138 mEq/L (ref 135–145)
Total Protein: 7.2 g/dL (ref 6.0–8.3)

## 2014-07-08 LAB — LIPID PANEL
CHOL/HDL RATIO: 4.8 ratio
Cholesterol: 196 mg/dL (ref 0–200)
HDL: 41 mg/dL (ref >=40)
LDL CALC: 125 mg/dL — AB (ref 0–99)
Triglycerides: 149 mg/dL (ref <150)
VLDL: 30 mg/dL (ref 0–40)

## 2014-07-08 LAB — CBC WITH DIFFERENTIAL/PLATELET
BASOS ABS: 0 10*3/uL (ref 0.0–0.1)
Basophils Relative: 0 % (ref 0–1)
Eosinophils Absolute: 0.2 10*3/uL (ref 0.0–0.7)
Eosinophils Relative: 3 % (ref 0–5)
HCT: 48.4 % (ref 39.0–52.0)
Hemoglobin: 16.3 g/dL (ref 13.0–17.0)
Lymphocytes Relative: 19 % (ref 12–46)
Lymphs Abs: 1.5 10*3/uL (ref 0.7–4.0)
MCH: 30.6 pg (ref 26.0–34.0)
MCHC: 33.7 g/dL (ref 30.0–36.0)
MCV: 90.8 fL (ref 78.0–100.0)
MPV: 10 fL (ref 8.6–12.4)
Monocytes Absolute: 0.4 10*3/uL (ref 0.1–1.0)
Monocytes Relative: 5 % (ref 3–12)
NEUTROS ABS: 5.6 10*3/uL (ref 1.7–7.7)
NEUTROS PCT: 73 % (ref 43–77)
Platelets: 332 10*3/uL (ref 150–400)
RBC: 5.33 MIL/uL (ref 4.22–5.81)
RDW: 13.4 % (ref 11.5–15.5)
WBC: 7.7 10*3/uL (ref 4.0–10.5)

## 2014-07-08 MED ORDER — DICLOFENAC SODIUM 1 % TD GEL: 2.0000 g | Freq: Four times a day (QID) | TRANSDERMAL | Status: DC

## 2014-07-08 MED ORDER — AMLODIPINE BESYLATE 5 MG PO TABS: 5.0000 mg | ORAL_TABLET | Freq: Every day | ORAL | Status: DC

## 2014-07-08 MED ORDER — FLUTICASONE PROPIONATE 50 MCG/ACT NA SUSP: NASAL | Status: DC

## 2014-07-08 MED ORDER — MELOXICAM 15 MG PO TABS: 15.0000 mg | ORAL_TABLET | Freq: Every day | ORAL | Status: DC

## 2014-07-08 NOTE — Patient Instructions (Addendum)
Medial Epicondylitis (Golfer's Elbow) with Rehab Medial epicondylitis involves inflammation and pain around the inner (medial) portion of the elbow. This pain is caused by inflammation of the tendons in the forearm that flex (bring down) the wrist. Medial epicondylitis is also called golfer's elbow, because it is common among golfers. However, it may occur in any individual who flexes the wrist regularly. If medial epicondylitis is left untreated, it may become a chronic problem. SYMPTOMS   Pain, tenderness, or inflammation over the inner (medial) side of the elbow.  Pain or weakness with gripping activities.  Pain that increases with wrist twisting motions (using a screwdriver, playing golf, bowling). CAUSES  Medial epicondylitis is caused by inflammation of the tendons that flex the wrist. Causes of injury may include:  Chronic, repetitive stress and strain to the tendons that run from the wrist and forearm to the elbow.  Sudden strain on the forearm, including wrist snap when serving balls with racquet sports, or throwing a baseball. RISK INCREASES WITH:  Sports or occupations that require repetitive and/or strenuous forearm and wrist movements (pitching a baseball, golfing, carpentry).  Poor wrist and forearm strength and flexibility.  Failure to warm up properly before activity.  Resuming activity before healing, rehabilitation, and conditioning are complete. PREVENTION   Warm up and stretch properly before activity.  Maintain physical fitness:  Strength, flexibility, and endurance.  Cardiovascular fitness.  Wear and use properly fitted equipment.  Learn and use proper technique and have a coach correct improper technique.  Wear a tennis elbow (counterforce) brace. PROGNOSIS  The course of this condition depends on the degree of the injury. If treated properly, acute cases (symptoms lasting less than 4 weeks) are often resolved in 2 to 6 weeks. Chronic (longer lasting  cases) often resolve in 3 to 6 months, but may require physical therapy. RELATED COMPLICATIONS   Frequently recurring symptoms, resulting in a chronic problem. Properly treating the problem the first time decreases frequency of recurrence.  Chronic inflammation, scarring, and partial tendon tear, requiring surgery.  Delayed healing or resolution of symptoms. TREATMENT  Treatment first involves the use of ice and medicine, to reduce pain and inflammation. Strengthening and stretching exercises may reduce discomfort, if performed regularly. These exercises may be performed at home, if the condition is an acute injury. Chronic cases may require a referral to a physical therapist for evaluation and treatment. Your caregiver may advise a corticosteroid injection to help reduce inflammation. Rarely, surgery is needed. MEDICATION  If pain medicine is needed, nonsteroidal anti-inflammatory medicines (aspirin and ibuprofen), or other minor pain relievers (acetaminophen), are often advised.  Do not take pain medicine for 7 days before surgery.  Prescription pain relievers may be given, if your caregiver thinks they are needed. Use only as directed and only as much as you need.  Corticosteroid injections may be recommended. These injections should be reserved only for the most severe cases, because they can only be given a certain number of times. HEAT AND COLD  Cold treatment (icing) should be applied for 10 to 15 minutes every 2 to 3 hours for inflammation and pain, and immediately after activity that aggravates your symptoms. Use ice packs or an ice massage.  Heat treatment may be used before performing stretching and strengthening activities prescribed by your caregiver, physical therapist, or athletic trainer. Use a heat pack or a warm water soak. SEEK MEDICAL CARE IF: Symptoms get worse or do not improve in 2 weeks, despite treatment. EXERCISES  RANGE OF MOTION (  ROM) AND STRETCHING EXERCISES -  Epicondylitis, Medial (Golfer's Elbow) These exercises may help you when beginning to rehabilitate your injury. Your symptoms may go away with or without further involvement from your physician, physical therapist or athletic trainer. While completing these exercises, remember:   Restoring tissue flexibility helps normal motion to return to the joints. This allows healthier, less painful movement and activity.  An effective stretch should be held for at least 30 seconds.  A stretch should never be painful. You should only feel a gentle lengthening or release in the stretched tissue. RANGE OF MOTION - Wrist Flexion, Active-Assisted  Extend your right / left elbow with your fingers pointing down.*  Gently pull the back of your hand towards you, until you feel a gentle stretch on the top of your forearm.  Hold this position for _________5_ seconds. Repeat _______3___ times. Complete this exercise ______1-2____ times per day.  *If directed by your physician, physical therapist or athletic trainer, complete this stretch with your elbow bent, rather than extended. RANGE OF MOTION - Wrist Extension, Active-Assisted  Extend your right / left elbow and turn your palm upwards.*  Gently pull your palm and fingertips back, so your wrist extends and your fingers point more toward the ground.  You should feel a gentle stretch on the inside of your forearm.  Hold this position for ______5____ seconds. Repeat _______3___ times. Complete this exercise ______1-2____ times per day. *If directed by your physician, physical therapist or athletic trainer, complete this stretch with your elbow bent, rather than extended. STRETCH - Wrist Extension   Place your right / left fingertips on a tabletop leaving your elbow slightly bent. Your fingers should point backwards.  Gently press your fingers and palm down onto the table, by straightening your elbow. You should feel a stretch on the inside of your  forearm.  Hold this position for __________ seconds. Repeat __________ times. Complete this stretch __________ times per day.  STRENGTHENING EXERCISES - Epicondylitis, Medial (Golfer's Elbow) These exercises may help you when beginning to rehabilitate your injury. They may resolve your symptoms with or without further involvement from your physician, physical therapist or athletic trainer. While completing these exercises, remember:   Muscles can gain both the endurance and the strength needed for everyday activities through controlled exercises.  Complete these exercises as instructed by your physician, physical therapist or athletic trainer. Increase the resistance and repetitions only as guided.  You may experience muscle soreness or fatigue, but the pain or discomfort you are trying to eliminate should never worsen during these exercises. If this pain does get worse, stop and make sure you are following the directions exactly. If the pain is still present after adjustments, discontinue the exercise until you can discuss the trouble with your caregiver. STRENGTH - Wrist Flexors  Sit with your right / left forearm palm-up, and fully supported on a table or countertop. Your elbow should be resting below the height of your shoulder. Allow your wrist to extend over the edge of the surface.  Loosely holding a __________ weight, or a piece of rubber exercise band or tubing, slowly curl your hand up toward your forearm.  Hold this position for __________ seconds. Slowly lower the wrist back to the starting position in a controlled manner. Repeat __________ times. Complete this exercise __________ times per day.  STRENGTH - Wrist Extensors  Sit with your right / left forearm palm-down and fully supported. Your elbow should be resting below the height of your shoulder.  Allow your wrist to extend over the edge of the surface.  Loosely holding a __________ weight, or a piece of rubber exercise band  or tubing, slowly curl your hand up toward your forearm.  Hold this position for __________ seconds. Slowly lower the wrist back to the starting position in a controlled manner. Repeat __________ times. Complete this exercise __________ times per day.  STRENGTH - Ulnar Deviators  Stand with a ____________________ weight in your right / left hand, or sit while holding a rubber exercise band or tubing, with your healthy arm supported on a table or countertop.  Move your wrist so that your pinkie travels toward your forearm and your thumb moves away from your forearm.  Hold this position for __________ seconds and then slowly lower the wrist back to the starting position. Repeat __________ times. Complete this exercise __________ times per day STRENGTH - Grip   Grasp a tennis ball, a dense sponge, or a large, rolled sock in your hand.  Squeeze as hard as you can, without increasing any pain.  Hold this position for __________ seconds. Release your grip slowly. Repeat __________ times. Complete this exercise __________ times per day.  STRENGTH - Forearm Supinators   Sit with your right / left forearm supported on a table, keeping your elbow below shoulder height. Rest your hand over the edge, palm down.  Gently grip a hammer or a soup ladle.  Without moving your elbow, slowly turn your palm and hand upward to a "thumbs-up" position.  Hold this position for __________ seconds. Slowly return to the starting position. Repeat __________ times. Complete this exercise __________ times per day.  STRENGTH - Forearm Pronators  Sit with your right / left forearm supported on a table, keeping your elbow below shoulder height. Rest your hand over the edge, palm up.  Gently grip a hammer or a soup ladle.  Without moving your elbow, slowly turn your palm and hand upward to a "thumbs-up" position.  Hold this position for __________ seconds. Slowly return to the starting position. Repeat  __________ times. Complete this exercise __________ times per day.  Document Released: 04/21/2005 Document Revised: 07/14/2011 Document Reviewed: 08/03/2008 [facility] Patient Information 2015 [place], [place]. This information is not intended to replace advice given to you by your health care provider. Make sure you discuss any questions you have with your health care provider.  ice your elbow for 20 minutes once a day Do range of motion exercises with your hands in hot water for 5 minutes in the morning

## 2014-07-08 NOTE — Progress Notes (Addendum)
Subjective:    Patient ID: John Wall, male    DOB: 06/07/1960, 54 y.o.   MRN: [id number] This chart was scribed for Leandrew Koyanagi, MD by Martinique Peace, ED Scribe. The patient was seen in [facility]05. The patient's care was started at 10:12 AM.  HPI HPI Comments: John Wall is a 54 y.o. male who presents to the [facility] seeking annual exam. When asked, pt denies any chest pain that he use to previously feel whenever he would go running or engage in heavy activity. He states that he was told that he needed to have an echocardiogram and a stress test by the cardiologist (see chart) but explains he has not been able to because his insurance does not cover it and he is not able to afford it. He states that he is still actively running/exercising without difficulty.  History of hypertension, chest pain, and heart murmur.  Pt is non-smoker.   Pt also complains of pain and swelling in joints, specifically in knees and MIP joints bilaterally. He also notes pain in wrist and R elbow with pulling, pushing, and grabbing objects x 2 mos. He adds that he was told he has osteoarthritis by another provider which his mother also suffers from as well. No known injury and no specific swelling  he is not up to date with Tetanus   Other health maintenance issues are up-to-date    Patient Active Problem List   Diagnosis Date Noted  . HTN (hypertension) 02/03/2013  . Chest pain 02/03/2013  . Murmur 02/03/2013    History of pelvic pain evaluated 2 years ago by urology over a three-month period including 12 prostate biopsy and other studies before finding that he actually had a blockage from a chronic kidney stone which was removed by surgery His brother made this diagnosis from [place](MD there)  Review of Systems  Cardiovascular: Negative for chest pain.  Musculoskeletal: Positive for arthralgias.   the remainder of the 14 point review of systems is negative per form     Objective:   Physical Exam    Constitutional: He is oriented to person, place, and time. He appears well-developed and well-nourished.  HENT:  Head: Normocephalic and atraumatic.  Right Ear: Hearing, tympanic membrane, external ear and ear canal normal.  Left Ear: Hearing, tympanic membrane, external ear and ear canal normal.  Nose: Nose normal.  Mouth/Throat: Uvula is midline, oropharynx is clear and moist and mucous membranes are normal.  Eyes: Conjunctivae, EOM and lids are normal. Pupils are equal, round, and reactive to light. Right eye exhibits no discharge. Left eye exhibits no discharge. No scleral icterus.  Neck: Trachea normal and normal range of motion. Neck supple. Carotid bruit is not present.  Cardiovascular: Normal rate, regular rhythm, normal heart sounds, intact distal pulses and normal pulses.   No murmur heard. Pulmonary/Chest: Effort normal and breath sounds normal. No respiratory distress. He has no wheezes. He has no rhonchi. He has no rales.  Abdominal: Soft. Normal appearance and bowel sounds are normal. He exhibits no abdominal bruit. There is no tenderness.  Musculoskeletal: Normal range of motion. He exhibits no edema or tenderness.  Tender along the medial epicondyles with pain on supination and grip  Lymphadenopathy:       Head (right side): No submental, no submandibular, no tonsillar, no preauricular, no posterior auricular and no occipital adenopathy present.       Head (left side): No submental, no submandibular, no tonsillar, no preauricular, no posterior auricular and no  occipital adenopathy present.    He has no cervical adenopathy.  Neurological: He is alert and oriented to person, place, and time. He has normal strength and normal reflexes. No cranial nerve deficit or sensory deficit. Coordination and gait normal.  Skin: Skin is warm, dry and intact. No lesion and no rash noted.  Psychiatric: He has a normal mood and affect. His speech is normal and behavior is normal. Judgment and  thought content normal.   prostate exam deferred status post prostate biopsies BP 130/76 mmHg  Pulse 68  Temp(Src) 98.6 F (37 C) (Oral)  Ht 5\' 6"  (1.676 m)  Wt 168 lb 2 oz (76.261 kg)  BMI 27.15 kg/m2  SpO2 100%        Assessment & Plan:  I have completed the patient encounter in its entirety as documented by the scribe, with editing by me where necessary. John Wall, M.D.  Essential hypertension - Plan: CBC with Differential/Platelet, Comprehensive metabolic panel, Lipid panel, EKG 12-Lead  Allergic rhinitis  Medial right epicondylitis--- exercises given plus diclofenac patch  Murmur no longer present or at least not heard today.  Annual physical exam - Plan: PSA, Tdap vaccine greater than or equal to 7yo IM  Meds ordered this encounter  Medications  . amLODipine (NORVASC) 5 MG tablet    Sig: Take 1 tablet (5 mg total) by mouth daily.    Dispense:  90 tablet    Refill:  3  . meloxicam (MOBIC) 15 MG tablet    Sig: Take 1 tablet (15 mg total) by mouth daily.    Dispense:  30 tablet    Refill:  5  . fluticasone (FLONASE) 50 MCG/ACT nasal spray    Sig: 1 spray in each nostril twice a day during allergy season    Dispense:  16 g    Refill:  6  . diclofenac sodium (VOLTAREN) 1 % GEL    Sig: Apply 2 g topically 4 (four) times daily.    Dispense:  100 g    Refill:  3

## 2014-07-10 LAB — PSA: PSA: 0.64 ng/mL (ref <=4.00)

## 2014-07-11 ENCOUNTER — Encounter: Payer: Self-pay | Admitting: Internal Medicine

## 2015-07-07 ENCOUNTER — Ambulatory Visit (INDEPENDENT_AMBULATORY_CARE_PROVIDER_SITE_OTHER): Payer: 59 | Admitting: Internal Medicine

## 2015-07-07 VITALS — BP 130/70 | HR 72 | Temp 98.0°F | Resp 16 | Ht 67.0 in | Wt 172.0 lb

## 2015-07-07 DIAGNOSIS — R748 Abnormal levels of other serum enzymes: Secondary | ICD-10-CM | POA: Diagnosis not present

## 2015-07-07 DIAGNOSIS — Z0000 Encounter for general adult medical examination without abnormal findings: Secondary | ICD-10-CM | POA: Diagnosis not present

## 2015-07-07 DIAGNOSIS — I10 Essential (primary) hypertension: Secondary | ICD-10-CM | POA: Diagnosis not present

## 2015-07-07 LAB — CBC WITH DIFFERENTIAL/PLATELET
Basophils Absolute: 0.1 10*3/uL (ref 0.0–0.1)
Basophils Relative: 1 % (ref 0–1)
EOS ABS: 0.2 10*3/uL (ref 0.0–0.7)
Eosinophils Relative: 3 % (ref 0–5)
HEMATOCRIT: 50.9 % (ref 39.0–52.0)
Hemoglobin: 17.2 g/dL — ABNORMAL HIGH (ref 13.0–17.0)
LYMPHS ABS: 1.4 10*3/uL (ref 0.7–4.0)
LYMPHS PCT: 18 % (ref 12–46)
MCH: 30.5 pg (ref 26.0–34.0)
MCHC: 33.8 g/dL (ref 30.0–36.0)
MCV: 90.2 fL (ref 78.0–100.0)
MONOS PCT: 5 % (ref 3–12)
MPV: 9.8 fL (ref 8.6–12.4)
Monocytes Absolute: 0.4 10*3/uL (ref 0.1–1.0)
NEUTROS PCT: 73 % (ref 43–77)
Neutro Abs: 5.5 10*3/uL (ref 1.7–7.7)
PLATELETS: 314 10*3/uL (ref 150–400)
RBC: 5.64 MIL/uL (ref 4.22–5.81)
RDW: 13.5 % (ref 11.5–15.5)
WBC: 7.6 10*3/uL (ref 4.0–10.5)

## 2015-07-07 LAB — COMPREHENSIVE METABOLIC PANEL
ALK PHOS: 77 U/L (ref 40–115)
ALT: 63 U/L — ABNORMAL HIGH (ref 9–46)
AST: 43 U/L — ABNORMAL HIGH (ref 10–35)
Albumin: 4.8 g/dL (ref 3.6–5.1)
BUN: 14 mg/dL (ref 7–25)
CALCIUM: 9.8 mg/dL (ref 8.6–10.3)
CHLORIDE: 100 mmol/L (ref 98–110)
CO2: 30 mmol/L (ref 20–31)
Creat: 0.83 mg/dL (ref 0.70–1.33)
Glucose, Bld: 101 mg/dL — ABNORMAL HIGH (ref 65–99)
POTASSIUM: 4.6 mmol/L (ref 3.5–5.3)
Sodium: 140 mmol/L (ref 135–146)
TOTAL PROTEIN: 7.4 g/dL (ref 6.1–8.1)
Total Bilirubin: 0.8 mg/dL (ref 0.2–1.2)

## 2015-07-07 LAB — HEPATITIS C ANTIBODY: HCV AB: NEGATIVE

## 2015-07-07 LAB — LIPID PANEL
Cholesterol: 205 mg/dL — ABNORMAL HIGH (ref 125–200)
HDL: 45 mg/dL (ref >=40)
LDL Cholesterol: 127 mg/dL (ref <130)
TRIGLYCERIDES: 163 mg/dL — AB (ref <150)
Total CHOL/HDL Ratio: 4.6 Ratio (ref <=5.0)
VLDL: 33 mg/dL — AB (ref <30)

## 2015-07-07 MED ORDER — AMLODIPINE BESYLATE 5 MG PO TABS: 5.0000 mg | ORAL_TABLET | Freq: Every day | ORAL | Status: DC

## 2015-07-07 MED ORDER — MELOXICAM 15 MG PO TABS: 15.0000 mg | ORAL_TABLET | Freq: Every day | ORAL | Status: DC

## 2015-07-07 MED ORDER — FLUTICASONE PROPIONATE 50 MCG/ACT NA SUSP: NASAL | Status: DC

## 2015-07-07 NOTE — Progress Notes (Signed)
Subjective:  By signing my name below, I, [name], attest that this documentation has been prepared under the direction and in the presence of Tami Lin, MD. Electronically Signed: [name], [place]. 07/07/2015 , 8:36 AM .  Patient was seen in Room 10 .   Patient ID: John Wall, male    DOB: 1961-01-27, 55 y.o.   MRN: [id number] Chief Complaint  Patient presents with  . Medication Refill   HPI John Wall is a 55 y.o. male who presents to [facility] for medication refill and annual physical examination.  He's been doing generally well. He denies flu shot this season.   HTN His BP readings have been around 132/80-135/84. He is taking amlodipine '5mg'$  in the morning for HTN. He informs that he has headaches with some dizziness in the mornings. He would check his BP and measure at 115/70. Once a month only. Does not interfere with activity.  Right arm pain He also reports some pain in his right MCP joints. He also mentions right elbow pain bothering him again. He had an injection done but pain is slowly coming back. He's tried using a patch over the area but without relief. His brother is a doctor in [place] and informed him that the injections are not a permanent fix. He used to do pushups with his jogging for exercise; however, after the pain returned, he denies recent exercises.   Allergy rhinitis He uses flonase nasal spray only when allergies get bad.   Patient Active Problem List   Diagnosis Date Noted  . HTN (hypertension) 02/03/2013  . Chest pain 02/03/2013  . Murmur 02/03/2013    Current outpatient prescriptions:  .  amLODipine (NORVASC) 5 MG tablet, Take 1 tablet (5 mg total) by mouth daily., Disp: 90 tablet, Rfl: 3 .  fluticasone (FLONASE) 50 MCG/ACT nasal spray, 1 spray in each nostril twice a day during allergy season, Disp: 16 g, Rfl: 6 .  Multiple Vitamin (MULTIVITAMIN) tablet, Take 1 tablet by mouth daily., Disp: , Rfl:  .  diclofenac sodium (VOLTAREN)  1 % GEL, Apply 2 g topically 4 (four) times daily. (Patient not taking: Reported on 07/07/2015), Disp: 100 g, Rfl: 3 .  fluticasone (VERAMYST) 27.5 MCG/SPRAY nasal spray, Place 2 sprays into the nose daily. (Patient not taking: Reported on 07/07/2015), Disp: 10 g, Rfl: 12 .  meloxicam (MOBIC) 15 MG tablet, Take 1 tablet (15 mg total) by mouth daily., Disp: 30 tablet, Rfl: 5  Review of Systems  Constitutional: Negative for fever, chills, activity change and fatigue.  Musculoskeletal: Positive for myalgias and arthralgias. Negative for back pain and joint swelling.  Skin: Negative for rash and wound.  Neurological: Positive for dizziness and headaches. Negative for weakness and numbness.   remainder of review systems is negative    Objective:   Physical Exam  Constitutional: He is oriented to person, place, and time. He appears well-developed and well-nourished. No distress.  HENT:  Head: Normocephalic and atraumatic.  Right Ear: Hearing, tympanic membrane, external ear and ear canal normal.  Left Ear: Hearing, tympanic membrane, external ear and ear canal normal.  Nose: Nose normal.  Mouth/Throat: Uvula is midline, oropharynx is clear and moist and mucous membranes are normal.  Eyes: Conjunctivae, EOM and lids are normal. Pupils are equal, round, and reactive to light. Right eye exhibits no discharge. Left eye exhibits no discharge. No scleral icterus.  Neck: Trachea normal and normal range of motion. Neck supple. Carotid bruit is not present.  Cardiovascular: Normal  rate, regular rhythm, normal heart sounds, intact distal pulses and normal pulses.   No murmur heard. Pulmonary/Chest: Effort normal and breath sounds normal. No respiratory distress. He has no wheezes. He has no rhonchi. He has no rales.  Abdominal: Soft. Normal appearance and bowel sounds are normal. He exhibits no abdominal bruit. There is no tenderness.  Musculoskeletal: Normal range of motion. He exhibits no edema or  tenderness.  He is tender to palpation along the medial epicondyles and into the forearm. Minimal pain with range of motion of the wrist. Grip is full. No peripheral sensory losses.  Lymphadenopathy:       Head (right side): No submental, no submandibular, no tonsillar, no preauricular, no posterior auricular and no occipital adenopathy present.       Head (left side): No submental, no submandibular, no tonsillar, no preauricular, no posterior auricular and no occipital adenopathy present.    He has no cervical adenopathy.  Neurological: He is alert and oriented to person, place, and time. He has normal strength and normal reflexes. No cranial nerve deficit or sensory deficit. Coordination and gait normal.  Skin: Skin is warm, dry and intact. No lesion and no rash noted.  Psychiatric: He has a normal mood and affect. His speech is normal and behavior is normal. Judgment and thought content normal.  Nursing note and vitals reviewed. BP 130/70 mmHg  Pulse 72  Temp(Src) 98 F (36.7 C) (Oral)  Resp 16  Ht '5\' 7"'$  (1.702 m)  Wt 172 lb (78.019 kg)  BMI 26.93 kg/m2  SpO2 97%      Assessment & Plan:  I have completed the patient encounter in its entirety as documented by the scribe, with editing by me where necessary. Robert P. Laney Pastor, M.D. Essential hypertension - Plan: CBC with Differential/Platelet, Comprehensive metabolic panel, Lipid panel  Annual physical exam - Plan: CBC with Differential/Platelet, PSA, Hepatitis C antibody  Right medial epicondylitis --Okay for  Meloxicam Lu Duffel need follow-up with orthopedics//given exercises  Meds ordered this encounter  Medications  . amLODipine (NORVASC) 5 MG tablet    Sig: Take 1 tablet (5 mg total) by mouth daily.    Dispense:  90 tablet    Refill:  3  . fluticasone (FLONASE) 50 MCG/ACT nasal spray    Sig: 1 spray in each nostril twice a day during allergy season    Dispense:  16 g    Refill:  6  . meloxicam (MOBIC) 15 MG tablet     Sig: Take 1 tablet (15 mg total) by mouth daily.    Dispense:  30 tablet    Refill:  5     Addendum laboratory 07/08/2015 Results for orders placed or performed in visit on 07/07/15  CBC with Differential/Platelet  Result Value Ref Range   WBC 7.6 4.0 - 10.5 K/uL   RBC 5.64 4.22 - 5.81 MIL/uL   Hemoglobin 17.2 (H) 13.0 - 17.0 g/dL   HCT 50.9 39.0 - 52.0 %   MCV 90.2 78.0 - 100.0 fL   MCH 30.5 26.0 - 34.0 pg   MCHC 33.8 30.0 - 36.0 g/dL   RDW 13.5 11.5 - 15.5 %   Platelets 314 150 - 400 K/uL   MPV 9.8 8.6 - 12.4 fL   Neutrophils Relative % 73 43 - 77 %   Neutro Abs 5.5 1.7 - 7.7 K/uL   Lymphocytes Relative 18 12 - 46 %   Lymphs Abs 1.4 0.7 - 4.0 K/uL   Monocytes Relative 5 3 -  12 %   Monocytes Absolute 0.4 0.1 - 1.0 K/uL   Eosinophils Relative 3 0 - 5 %   Eosinophils Absolute 0.2 0.0 - 0.7 K/uL   Basophils Relative 1 0 - 1 %   Basophils Absolute 0.1 0.0 - 0.1 K/uL   Smear Review Criteria for review not met   Comprehensive metabolic panel  Result Value Ref Range   Sodium 140 135 - 146 mmol/L   Potassium 4.6 3.5 - 5.3 mmol/L   Chloride 100 98 - 110 mmol/L   CO2 30 20 - 31 mmol/L   Glucose, Bld 101 (H) 65 - 99 mg/dL   BUN 14 7 - 25 mg/dL   Creat 0.83 0.70 - 1.33 mg/dL   Total Bilirubin 0.8 0.2 - 1.2 mg/dL   Alkaline Phosphatase 77 40 - 115 U/L   AST 43 (H) 10 - 35 U/L   ALT 63 (H) 9 - 46 U/L   Total Protein 7.4 6.1 - 8.1 g/dL   Albumin 4.8 3.6 - 5.1 g/dL   Calcium 9.8 8.6 - 10.3 mg/dL  Lipid panel  Result Value Ref Range   Cholesterol 205 (H) 125 - 200 mg/dL   Triglycerides 163 (H) <150 mg/dL   HDL 45 >=40 mg/dL   Total CHOL/HDL Ratio 4.6 <=5.0 Ratio   VLDL 33 (H) <30 mg/dL   LDL Cholesterol 127 <130 mg/dL  PSA  Result Value Ref Range   PSA  <=4.00 ng/mL  Hepatitis C antibody  Result Value Ref Range   HCV Ab NEGATIVE NEGATIVE   PSA pending In view of cholesterol elevation plus hypertension we will discuss treatment Abnormal liver studies suggest fatty  infiltration versus other cause and will be investigated further//hep C negative///ultrasound next.

## 2015-07-07 NOTE — Patient Instructions (Signed)

## 2015-07-08 DIAGNOSIS — R748 Abnormal levels of other serum enzymes: Secondary | ICD-10-CM | POA: Insufficient documentation

## 2015-07-09 ENCOUNTER — Encounter: Payer: Self-pay | Admitting: *Deleted

## 2015-07-09 LAB — PSA: PSA: 0.54 ng/mL (ref <=4.00)

## 2015-07-10 ENCOUNTER — Telehealth: Payer: Self-pay

## 2015-07-10 NOTE — Telephone Encounter (Signed)
Called translating service.  Home phone number is disconnected. Tried twice. Work number takes to Haematologist for a company.  Can we generate a letter in [facility] with why he needs to have an ultrasound and send to translating service so they can translate and send to pt? Gentleman on the phone said they would be happy to do that for [place].  Letter needs to say -- You need to have an ultrasound of your liver due to elevated liver enzymes. This is likely fatty liver disease, but we need to make sure there is nothing more serious going on. You will get a phone call to schedule the ultrasound.

## 2015-07-10 NOTE — Telephone Encounter (Signed)
The patient would like an explanation for why he has an order placed for an ultrasound of the abdomen.  [facility] called him to schedule, but he did not understand because he only speaks [place].  A provider/clinical will need to explain the medical reasoning of the order to the translating service, so they can inform the patient.  Thank you.  Translation services #: [phone] Patient #: [phone]

## 2015-07-13 NOTE — Telephone Encounter (Signed)
Sent letter to Marva Panda at [facility] to call patient to interpret the message.

## 2015-07-18 ENCOUNTER — Other Ambulatory Visit: Payer: Self-pay | Admitting: Internal Medicine

## 2015-07-18 ENCOUNTER — Ambulatory Visit
Admission: RE | Admit: 2015-07-18 | Discharge: 2015-07-18 | Disposition: A | Discharge status: Home / Self Care | Payer: 59 | Source: Ambulatory Visit | Attending: Internal Medicine | Admitting: Internal Medicine

## 2015-07-18 DIAGNOSIS — R748 Abnormal levels of other serum enzymes: Secondary | ICD-10-CM

## 2015-07-18 DIAGNOSIS — R932 Abnormal findings on diagnostic imaging of liver and biliary tract: Secondary | ICD-10-CM

## 2015-07-30 ENCOUNTER — Ambulatory Visit
Admission: RE | Admit: 2015-07-30 | Discharge: 2015-07-30 | Disposition: A | Discharge status: Home / Self Care | Payer: 59 | Source: Ambulatory Visit | Attending: Internal Medicine | Admitting: Internal Medicine

## 2015-07-30 DIAGNOSIS — R932 Abnormal findings on diagnostic imaging of liver and biliary tract: Secondary | ICD-10-CM

## 2015-07-30 MED ORDER — IOPAMIDOL (ISOVUE-300) INJECTION 61%
100.0000 mL | Freq: Once | INTRAVENOUS | Status: AC | PRN
  Administered 2015-07-30: 100 mL via INTRAVENOUS

## 2015-07-31 ENCOUNTER — Telehealth: Payer: Self-pay

## 2015-07-31 NOTE — Telephone Encounter (Signed)
Patient would like for someone to call him when the ct results are in.

## 2015-08-01 NOTE — Telephone Encounter (Signed)
Spoke with patient in the office. His brother, 81.56 years older, a physician in [place], had mildly elevated ALT and AST, and US revealed fatty infiltration of the liver. They have spoken by phone.  I will speak with Dr. Laney Pastor and call the patient with recommendations. Will also call radiology for additional guidance they may be able to provide.  Removed incorrect telephone numbers from the record.

## 2015-08-01 NOTE — Telephone Encounter (Signed)
Explained the results to the patient. He is exceptionally frustrated at the prospect of another test. His insurance did not cover the [place] (he paid $165), and also is not covering the CT scan. He does not believe that an MRI will be covered, either. He plans to come in to the office this afternoon to discuss what to do. I will print the results and plan to share them with him upon his arrival.

## 2015-08-01 NOTE — Telephone Encounter (Signed)
Thanks---no good answer without insurance

## 2015-08-02 NOTE — Telephone Encounter (Signed)
Wow--your dogged determinism paid off again!!!

## 2015-08-02 NOTE — Telephone Encounter (Signed)
Spoke with Dr. Laney Pastor and then with Dr. Maryland Pink [facility]).  Reviewed labs performed accompanying the elevated LFTs, noted incidentally. Normal Colonoscopy with Dr. Deatra Ina in 2014.  Of the 3 concerning lesions on [place], 2 appear much less suspicious on CT, but the third is subcapsular, mildly hypervascular and has an irregular contour.  It is possible that all three represent hemangiomas, with scarring/sclerosing causing the irregular appearance of the one lesion on CT scan.  However, could represent metastatic disease, most likely from the colon. As such, the normal colonoscopy 3 years ago is reassuring. There are no visible nodes, and no other evidence of colon cancer on this CT.  Dr. Maryland Pink wonders if the patient's urology evaluation included a CT that could be compared to this one. If so, that could help determine the course from here.  Left a message for the patient to return my call. Would like to obtain any abdominal imaging done by urology for Dr. Maryland Pink to review alongside newer images.  Could repeat CT in 6 months to assess stability if patient is comfortable with that, as he has no other evidence of a primary malignancy. Ct would be better choice for assessment, but [place] would be less costly.

## 2015-08-02 NOTE — Telephone Encounter (Signed)
Spoke with several urology offices in [facility] and found that the patient was seen by Dr. Harlow Asa at [facility] in 2010 and had a CT urogram there.  The image was uploaded from their old system into their new system and Dr. Maryland Pink was able to compare it with the more recent imaging.  The lesions are all present on the CT urogram and are unchanged. Dr. Maryland Pink will add an addendum to the CT scan from this month to include this comparison. No additional evaluation is needed.  Patient advised of this and expresses great relief.

## 2015-08-11 ENCOUNTER — Other Ambulatory Visit: Payer: Self-pay | Admitting: Internal Medicine

## 2015-08-11 NOTE — Telephone Encounter (Signed)
Pt came into the clinic today requesting a refill of diclofenac sodium (VOLTAREN) 1 % GEL. Pt refused to be seen for an office visit because he did not want to pay a copay.

## 2015-08-12 NOTE — Telephone Encounter (Signed)
Is it ok to fill?  Patient was seen in 3/17 in his medication list it stated patient was not taking..  See note below

## 2016-05-12 DIAGNOSIS — M79672 Pain in left foot: Secondary | ICD-10-CM | POA: Diagnosis not present

## 2016-05-12 DIAGNOSIS — M79671 Pain in right foot: Secondary | ICD-10-CM | POA: Diagnosis not present

## 2016-05-21 DIAGNOSIS — M2580 Other specified joint disorders, unspecified joint: Secondary | ICD-10-CM | POA: Diagnosis not present

## 2016-06-04 DIAGNOSIS — M79671 Pain in right foot: Secondary | ICD-10-CM | POA: Diagnosis not present

## 2016-06-04 DIAGNOSIS — M2580 Other specified joint disorders, unspecified joint: Secondary | ICD-10-CM | POA: Diagnosis not present

## 2016-08-27 ENCOUNTER — Ambulatory Visit (INDEPENDENT_AMBULATORY_CARE_PROVIDER_SITE_OTHER): Payer: 59 | Admitting: Emergency Medicine

## 2016-08-27 VITALS — BP 144/86 | HR 76 | Temp 98.4°F | Resp 18 | Ht 67.0 in | Wt 170.0 lb

## 2016-08-27 DIAGNOSIS — Z0000 Encounter for general adult medical examination without abnormal findings: Secondary | ICD-10-CM | POA: Diagnosis not present

## 2016-08-27 DIAGNOSIS — I10 Essential (primary) hypertension: Secondary | ICD-10-CM

## 2016-08-27 MED ORDER — AMLODIPINE BESYLATE 5 MG PO TABS: 5.0000 mg | ORAL_TABLET | Freq: Every day | ORAL | 3 refills | Status: DC

## 2016-08-27 NOTE — Progress Notes (Signed)
John Wall 56 y.o.   Chief Complaint  Patient presents with  . Annual Exam  . Hypertension  . Allergies    HISTORY OF PRESENT ILLNESS: This is a 56 y.o. male here for annual exam; has no complaints or medical concerns.  HPI   Prior to Admission medications   Medication Sig Start Date End Date Taking? Authorizing Provider  amLODipine (NORVASC) 5 MG tablet Take 1 tablet (5 mg total) by mouth daily. 08/27/16  Yes [facility], MD  GuaiFENesin (TUSSIN PO) Take by mouth.   Yes Historical Provider, MD  Multiple Vitamin (MULTIVITAMIN) tablet Take 1 tablet by mouth daily.   Yes Historical Provider, MD    Allergies  Allergen Reactions  . Shrimp [Shellfish Allergy] Shortness Of Breath and Swelling  . Sulfa Antibiotics Rash    Patient Active Problem List   Diagnosis Date Noted  . Abnormal liver enzymes-2017 07/08/2015  . HTN (hypertension) 02/03/2013  . Chest pain 02/03/2013  . Murmur 02/03/2013    Past Medical History:  Diagnosis Date  . Allergy   . Arthritis    hands  . Hypertension   . Kidney stone 2012  . Plantar fasciitis    left foot    Past Surgical History:  Procedure Laterality Date  . APPENDECTOMY  1991  . KIDNEY STONE SURGERY  2012  . LIPOMA EXCISION  2004   abdominal wall LEFT  . RHINOPLASTY  1977  . TENDON REPAIR Left 2002   ankle  . TESTICLE SURGERY  1998    Social History   Social History  . Marital status: Married    Spouse name: N/A  . Number of children: N/A  . Years of education: N/A   Occupational History  . Not on file.   Social History Main Topics  . Smoking status: Never Smoker  . Smokeless tobacco: Never Used  . Alcohol use 1.2 oz/week    2 Standard drinks or equivalent per week  . Drug use: Unknown  . Sexual activity: Not on file   Other Topics Concern  . Not on file   Social History Narrative  . No narrative on file    Family History  Problem Relation Age of Onset  . Heart attack Father   . Hypertension  Mother   . Colon cancer Neg Hx      Review of Systems  Constitutional: Negative.  Negative for fever, malaise/fatigue and weight loss.  HENT: Negative.  Negative for congestion, nosebleeds and sore throat.   Eyes: Negative.  Negative for blurred vision, double vision and redness.  Respiratory: Negative.  Negative for cough, hemoptysis and shortness of breath.   Cardiovascular: Negative.  Negative for chest pain, palpitations and leg swelling.  Gastrointestinal: Negative.  Negative for abdominal pain, diarrhea, nausea and vomiting.  Genitourinary: Negative.  Negative for dysuria and hematuria.  Musculoskeletal: Negative.  Negative for back pain, myalgias and neck pain.  Skin: Negative.  Negative for rash.  Neurological: Negative.  Negative for dizziness, sensory change, focal weakness and headaches.  Endo/Heme/Allergies: Negative.   Psychiatric/Behavioral: Negative.   All other systems reviewed and are negative.    Vitals:   08/27/16 0841 08/27/16 0854  BP: (!) 146/84 (!) 144/86  Pulse: 76   Resp: 18   Temp: 98.4 F (36.9 C)      Physical Exam  Constitutional: He is oriented to person, place, and time. He appears well-developed and well-nourished.  HENT:  Head: Normocephalic and atraumatic.  Nose: Nose normal.  Mouth/Throat:  Oropharynx is clear and moist. No oropharyngeal exudate.  Eyes: Conjunctivae and EOM are normal. Pupils are equal, round, and reactive to light.  Neck: Normal range of motion. Neck supple. No JVD present. No thyromegaly present.  Cardiovascular: Normal rate, regular rhythm, normal heart sounds and intact distal pulses.   Pulmonary/Chest: Effort normal and breath sounds normal.  Abdominal: Soft. Bowel sounds are normal. He exhibits no distension. There is no tenderness.  Musculoskeletal: Normal range of motion.  Lymphadenopathy:    He has no cervical adenopathy.  Neurological: He is alert and oriented to person, place, and time. No sensory deficit. He  exhibits normal muscle tone. Coordination normal.  Skin: Skin is warm and dry. Capillary refill takes less than 2 seconds. No rash noted.  Psychiatric: He has a normal mood and affect. His behavior is normal.  Vitals reviewed.    ASSESSMENT & PLAN: John Wall was seen today for annual exam, hypertension and allergies.  Diagnoses and all orders for this visit:  Routine general medical examination at a health care facility -     CBC with Differential -     Comprehensive metabolic panel -     Hemoglobin A1c -     Lipid panel -     PSA(Must document that pt has been informed of limitations of PSA testing.) -     TSH -     HIV antibody  Essential hypertension  Other orders -     amLODipine (NORVASC) 5 MG tablet; Take 1 tablet (5 mg total) by mouth daily.    Patient Instructions       IF you received an x-ray today, you will receive an invoice from [facility]. Please contact [facility] at [phone] with questions or concerns regarding your invoice.   IF you received labwork today, you will receive an invoice from [facility]. Please contact [facility] at [phone] with questions or concerns regarding your invoice.   Our billing staff will not be able to assist you with questions regarding bills from these companies.  You will be contacted with the lab results as soon as they are available. The fastest way to get your results is to activate your My Chart account. Instructions are located on the last page of this paperwork. If you have not heard from [place] regarding the results in 2 weeks, please contact this office.        Health Maintenance, Male A healthy lifestyle and preventive care is important for your health and wellness. Ask your health care provider about what schedule of regular examinations is right for you. What should I know about weight and diet?  Eat a Healthy Diet  Eat plenty of vegetables, fruits, whole grains, low-fat dairy products, and  lean protein.  Do not eat a lot of foods high in solid fats, added sugars, or salt. Maintain a Healthy Weight  Regular exercise can help you achieve or maintain a healthy weight. You should:  Do at least 150 minutes of exercise each week. The exercise should increase your heart rate and make you sweat (moderate-intensity exercise).  Do strength-training exercises at least twice a week. Watch Your Levels of Cholesterol and Blood Lipids  Have your blood tested for lipids and cholesterol every 5 years starting at 58 years of age. If you are at high risk for heart disease, you should start having your blood tested when you are 58 years old. You may need to have your cholesterol levels checked more often if:  Your lipid or  cholesterol levels are high.  You are older than 58 years of age.  You are at high risk for heart disease. What should I know about cancer screening? Many types of cancers can be detected early and may often be prevented. Lung Cancer  You should be screened every year for lung cancer if:  You are a current smoker who has smoked for at least 30 years.  You are a former smoker who has quit within the past 15 years.  Talk to your health care provider about your screening options, when you should start screening, and how often you should be screened. Colorectal Cancer  Routine colorectal cancer screening usually begins at 58 years of age and should be repeated every 5-10 years until you are 58 years old. You may need to be screened more often if early forms of precancerous polyps or small growths are found. Your health care provider may recommend screening at an earlier age if you have risk factors for colon cancer.  Your health care provider may recommend using home test kits to check for hidden blood in the stool.  A small camera at the end of a tube can be used to examine your colon (sigmoidoscopy or colonoscopy). This checks for the earliest forms of colorectal  cancer. Prostate and Testicular Cancer  Depending on your age and overall health, your health care provider may do certain tests to screen for prostate and testicular cancer.  Talk to your health care provider about any symptoms or concerns you have about testicular or prostate cancer. Skin Cancer  Check your skin from head to toe regularly.  Tell your health care provider about any new moles or changes in moles, especially if:  There is a change in a mole's size, shape, or color.  You have a mole that is larger than a pencil eraser.  Always use sunscreen. Apply sunscreen liberally and repeat throughout the day.  Protect yourself by wearing long sleeves, pants, a wide-brimmed hat, and sunglasses when outside. What should I know about heart disease, diabetes, and high blood pressure?  If you are 24-72 years of age, have your blood pressure checked every 3-5 years. If you are 74 years of age or older, have your blood pressure checked every year. You should have your blood pressure measured twice-once when you are at a hospital or clinic, and once when you are not at a hospital or clinic. Record the average of the two measurements. To check your blood pressure when you are not at a hospital or clinic, you can use:  An automated blood pressure machine at a pharmacy.  A home blood pressure monitor.  Talk to your health care provider about your target blood pressure.  If you are between 73-19 years old, ask your health care provider if you should take aspirin to prevent heart disease.  Have regular diabetes screenings by checking your fasting blood sugar level.  If you are at a normal weight and have a low risk for diabetes, have this test once every three years after the age of 75.  If you are overweight and have a high risk for diabetes, consider being tested at a younger age or more often.  A one-time screening for abdominal aortic aneurysm (AAA) by ultrasound is recommended for men  aged 88-75 years who are current or former smokers. What should I know about preventing infection? Hepatitis B  If you have a higher risk for hepatitis B, you should be screened for this virus.  Talk with your health care provider to find out if you are at risk for hepatitis B infection. Hepatitis C  Blood testing is recommended for:  Everyone born from [birth year] through 1967.  Anyone with known risk factors for hepatitis C. Sexually Transmitted Diseases (STDs)  You should be screened each year for STDs including gonorrhea and chlamydia if:  You are sexually active and are younger than 58 years of age.  You are older than 58 years of age and your health care provider tells you that you are at risk for this type of infection.  Your sexual activity has changed since you were last screened and you are at an increased risk for chlamydia or gonorrhea. Ask your health care provider if you are at risk.  Talk with your health care provider about whether you are at high risk of being infected with HIV. Your health care provider may recommend a prescription medicine to help prevent HIV infection. What else can I do?  Schedule regular health, dental, and eye exams.  Stay current with your vaccines (immunizations).  Do not use any tobacco products, such as cigarettes, chewing tobacco, and e-cigarettes. If you need help quitting, ask your health care provider.  Limit alcohol intake to no more than 2 drinks per day. One drink equals 12 ounces of beer, 5 ounces of wine, or 1 ounces of hard liquor.  Do not use street drugs.  Do not share needles.  Ask your health care provider for help if you need support or information about quitting drugs.  Tell your health care provider if you often feel depressed.  Tell your health care provider if you have ever been abused or do not feel safe at home. This information is not intended to replace advice given to you by your health care provider. Make sure you  discuss any questions you have with your health care provider. Document Released: 10/18/2007 Document Revised: 12/19/2015 Document Reviewed: 01/23/2015 Elsevier Interactive Patient Education  2017 [facility]) Exercise Recommendation  Being physically active is important to prevent heart disease and stroke, the nation's No. 1and No. 5killers. To improve overall cardiovascular health, we suggest at least 150 minutes per week of moderate exercise or 75 minutes per week of vigorous exercise (or a combination of moderate and vigorous activity). Thirty minutes a day, five times a week is an easy goal to remember. You will also experience benefits even if you divide your time into two or three segments of 10 to 15 minutes per day.  For people who would benefit from lowering their blood pressure or cholesterol, we recommend 40 minutes of aerobic exercise of moderate to vigorous intensity three to four times a week to lower the risk for heart attack and stroke.  Physical activity is anything that makes you move your body and burn calories.  This includes things like climbing stairs or playing sports. Aerobic exercises benefit your heart, and include walking, jogging, swimming or biking. Strength and stretching exercises are best for overall stamina and flexibility.  The simplest, positive change you can make to effectively improve your heart health is to start walking. It's enjoyable, free, easy, social and great exercise. A walking program is flexible and boasts high success rates because people can stick with it. It's easy for walking to become a regular and satisfying part of life.   For Overall Cardiovascular Health:  At least 30 minutes of moderate-intensity aerobic activity at least 5 days per week for  a total of 150  OR   At least 25 minutes of vigorous aerobic activity at least 3 days per week for a total of 75 minutes; or a combination of moderate- and  vigorous-intensity aerobic activity  AND   Moderate- to high-intensity muscle-strengthening activity at least 2 days per week for additional health benefits.  For Lowering Blood Pressure and Cholesterol  An average 40 minutes of moderate- to vigorous-intensity aerobic activity 3 or 4 times per week  What if I can't make it to the time goal? Something is always better than nothing! And everyone has to start somewhere. Even if you've been sedentary for years, today is the day you can begin to make healthy changes in your life. If you don't think you'll make it for 30 or 40 minutes, set a reachable goal for today. You can work up toward your overall goal by increasing your time as you get stronger. Don't let all-or-nothing thinking rob you of doing what you can every day.  Source:[email or web address], MD [facility]

## 2016-08-27 NOTE — Patient Instructions (Addendum)
   IF you received an x-ray today, you will receive an invoice from [facility]. Please contact [facility] at [phone] with questions or concerns regarding your invoice.   IF you received labwork today, you will receive an invoice from [facility]. Please contact [facility] at [phone] with questions or concerns regarding your invoice.   Our billing staff will not be able to assist you with questions regarding bills from these companies.  You will be contacted with the lab results as soon as they are available. The fastest way to get your results is to activate your My Chart account. Instructions are located on the last page of this paperwork. If you have not heard from us regarding the results in 2 weeks, please contact this office.        Health Maintenance, Male A healthy lifestyle and preventive care is important for your health and wellness. Ask your health care provider about what schedule of regular examinations is right for you. What should I know about weight and diet?  Eat a Healthy Diet  Eat plenty of vegetables, fruits, whole grains, low-fat dairy products, and lean protein.  Do not eat a lot of foods high in solid fats, added sugars, or salt. Maintain a Healthy Weight  Regular exercise can help you achieve or maintain a healthy weight. You should:  Do at least 150 minutes of exercise each week. The exercise should increase your heart rate and make you sweat (moderate-intensity exercise).  Do strength-training exercises at least twice a week. Watch Your Levels of Cholesterol and Blood Lipids  Have your blood tested for lipids and cholesterol every 5 years starting at 58 years of age. If you are at high risk for heart disease, you should start having your blood tested when you are 58 years old. You may need to have your cholesterol levels checked more often if:  Your lipid or cholesterol levels are high.  You are older than 58 years of  age.  You are at high risk for heart disease. What should I know about cancer screening? Many types of cancers can be detected early and may often be prevented. Lung Cancer  You should be screened every year for lung cancer if:  You are a current smoker who has smoked for at least 30 years.  You are a former smoker who has quit within the past 15 years.  Talk to your health care provider about your screening options, when you should start screening, and how often you should be screened. Colorectal Cancer  Routine colorectal cancer screening usually begins at 58 years of age and should be repeated every 5-10 years until you are 58 years old. You may need to be screened more often if early forms of precancerous polyps or small growths are found. Your health care provider may recommend screening at an earlier age if you have risk factors for colon cancer.  Your health care provider may recommend using home test kits to check for hidden blood in the stool.  A small camera at the end of a tube can be used to examine your colon (sigmoidoscopy or colonoscopy). This checks for the earliest forms of colorectal cancer. Prostate and Testicular Cancer  Depending on your age and overall health, your health care provider may do certain tests to screen for prostate and testicular cancer.  Talk to your health care provider about any symptoms or concerns you have about testicular or prostate cancer. Skin Cancer  Check your skin from head   to toe regularly.  Tell your health care provider about any new moles or changes in moles, especially if:  There is a change in a mole's size, shape, or color.  You have a mole that is larger than a pencil eraser.  Always use sunscreen. Apply sunscreen liberally and repeat throughout the day.  Protect yourself by wearing long sleeves, pants, a wide-brimmed hat, and sunglasses when outside. What should I know about heart disease, diabetes, and high blood  pressure?  If you are 18-39 years of age, have your blood pressure checked every 3-5 years. If you are 40 years of age or older, have your blood pressure checked every year. You should have your blood pressure measured twice-once when you are at a hospital or clinic, and once when you are not at a hospital or clinic. Record the average of the two measurements. To check your blood pressure when you are not at a hospital or clinic, you can use:  An automated blood pressure machine at a pharmacy.  A home blood pressure monitor.  Talk to your health care provider about your target blood pressure.  If you are between 45-79 years old, ask your health care provider if you should take aspirin to prevent heart disease.  Have regular diabetes screenings by checking your fasting blood sugar level.  If you are at a normal weight and have a low risk for diabetes, have this test once every three years after the age of 45.  If you are overweight and have a high risk for diabetes, consider being tested at a younger age or more often.  A one-time screening for abdominal aortic aneurysm (AAA) by ultrasound is recommended for men aged 65-75 years who are current or former smokers. What should I know about preventing infection? Hepatitis B  If you have a higher risk for hepatitis B, you should be screened for this virus. Talk with your health care provider to find out if you are at risk for hepatitis B infection. Hepatitis C  Blood testing is recommended for:  Everyone born from 1945 through 1965.  Anyone with known risk factors for hepatitis C. Sexually Transmitted Diseases (STDs)  You should be screened each year for STDs including gonorrhea and chlamydia if:  You are sexually active and are younger than 58 years of age.  You are older than 58 years of age and your health care provider tells you that you are at risk for this type of infection.  Your sexual activity has changed since you were last  screened and you are at an increased risk for chlamydia or gonorrhea. Ask your health care provider if you are at risk.  Talk with your health care provider about whether you are at high risk of being infected with HIV. Your health care provider may recommend a prescription medicine to help prevent HIV infection. What else can I do?  Schedule regular health, dental, and eye exams.  Stay current with your vaccines (immunizations).  Do not use any tobacco products, such as cigarettes, chewing tobacco, and e-cigarettes. If you need help quitting, ask your health care provider.  Limit alcohol intake to no more than 2 drinks per day. One drink equals 12 ounces of beer, 5 ounces of wine, or 1 ounces of hard liquor.  Do not use street drugs.  Do not share needles.  Ask your health care provider for help if you need support or information about quitting drugs.  Tell your health care provider if you   often feel depressed.  Tell your health care provider if you have ever been abused or do not feel safe at home. This information is not intended to replace advice given to you by your health care provider. Make sure you discuss any questions you have with your health care provider. Document Released: 10/18/2007 Document Revised: 12/19/2015 Document Reviewed: 01/23/2015 Elsevier Interactive Patient Education  2017 [facility]) Exercise Recommendation  Being physically active is important to prevent heart disease and stroke, the nation's No. 1and No. 5killers. To improve overall cardiovascular health, we suggest at least 150 minutes per week of moderate exercise or 75 minutes per week of vigorous exercise (or a combination of moderate and vigorous activity). Thirty minutes a day, five times a week is an easy goal to remember. You will also experience benefits even if you divide your time into two or three segments of 10 to 15 minutes per day.  For people who would  benefit from lowering their blood pressure or cholesterol, we recommend 40 minutes of aerobic exercise of moderate to vigorous intensity three to four times a week to lower the risk for heart attack and stroke.  Physical activity is anything that makes you move your body and burn calories.  This includes things like climbing stairs or playing sports. Aerobic exercises benefit your heart, and include walking, jogging, swimming or biking. Strength and stretching exercises are best for overall stamina and flexibility.  The simplest, positive change you can make to effectively improve your heart health is to start walking. It's enjoyable, free, easy, social and great exercise. A walking program is flexible and boasts high success rates because people can stick with it. It's easy for walking to become a regular and satisfying part of life.   For Overall Cardiovascular Health:  At least 30 minutes of moderate-intensity aerobic activity at least 5 days per week for a total of 150  OR   At least 25 minutes of vigorous aerobic activity at least 3 days per week for a total of 75 minutes; or a combination of moderate- and vigorous-intensity aerobic activity  AND   Moderate- to high-intensity muscle-strengthening activity at least 2 days per week for additional health benefits.  For Lowering Blood Pressure and Cholesterol  An average 40 minutes of moderate- to vigorous-intensity aerobic activity 3 or 4 times per week  What if I can't make it to the time goal? Something is always better than nothing! And everyone has to start somewhere. Even if you've been sedentary for years, today is the day you can begin to make healthy changes in your life. If you don't think you'll make it for 30 or 40 minutes, set a reachable goal for today. You can work up toward your overall goal by increasing your time as you get stronger. Don't let all-or-nothing thinking rob you of doing what you can every day.   Source:http://www.heart.org

## 2016-08-29 LAB — CBC WITH DIFFERENTIAL/PLATELET
BASOS ABS: 0 10*3/uL (ref 0.0–0.2)
BASOS: 1 %
EOS (ABSOLUTE): 0.5 10*3/uL — ABNORMAL HIGH (ref 0.0–0.4)
EOS: 7 %
HEMATOCRIT: 48.3 % (ref 37.5–51.0)
HEMOGLOBIN: 16.7 g/dL (ref 13.0–17.7)
IMMATURE GRANS (ABS): 0 10*3/uL (ref 0.0–0.1)
Immature Granulocytes: 0 %
Lymphocytes Absolute: 1.5 10*3/uL (ref 0.7–3.1)
Lymphs: 22 %
MCH: 30.8 pg (ref 26.6–33.0)
MCHC: 34.6 g/dL (ref 31.5–35.7)
MCV: 89 fL (ref 79–97)
MONOS ABS: 0.4 10*3/uL (ref 0.1–0.9)
Monocytes: 5 %
NEUTROS ABS: 4.7 10*3/uL (ref 1.4–7.0)
Neutrophils: 65 %
Platelets: 274 10*3/uL (ref 150–379)
RBC: 5.42 x10E6/uL (ref 4.14–5.80)
RDW: 14 % (ref 12.3–15.4)
WBC: 7.2 10*3/uL (ref 3.4–10.8)

## 2016-08-29 LAB — TSH: TSH: 1.75 u[IU]/mL (ref 0.450–4.500)

## 2016-08-29 LAB — COMPREHENSIVE METABOLIC PANEL
ALBUMIN: 4.7 g/dL (ref 3.5–5.5)
ALK PHOS: 88 IU/L (ref 39–117)
ALT: 53 IU/L — ABNORMAL HIGH (ref 0–44)
AST: 41 IU/L — ABNORMAL HIGH (ref 0–40)
Albumin/Globulin Ratio: 1.7 (ref 1.2–2.2)
BUN / CREAT RATIO: 13 (ref 9–20)
BUN: 11 mg/dL (ref 6–24)
Bilirubin Total: 0.6 mg/dL (ref 0.0–1.2)
CALCIUM: 9.7 mg/dL (ref 8.7–10.2)
CO2: 24 mmol/L (ref 18–29)
CREATININE: 0.83 mg/dL (ref 0.76–1.27)
Chloride: 98 mmol/L (ref 96–106)
GFR calc Af Amer: 113 mL/min/{1.73_m2} (ref >59)
GFR, EST NON AFRICAN AMERICAN: 98 mL/min/{1.73_m2} (ref >59)
GLOBULIN, TOTAL: 2.7 g/dL (ref 1.5–4.5)
GLUCOSE: 98 mg/dL (ref 65–99)
Potassium: 3.9 mmol/L (ref 3.5–5.2)
SODIUM: 140 mmol/L (ref 134–144)
Total Protein: 7.4 g/dL (ref 6.0–8.5)

## 2016-08-29 LAB — LIPID PANEL
CHOL/HDL RATIO: 4.8 ratio (ref 0.0–5.0)
Cholesterol, Total: 209 mg/dL — ABNORMAL HIGH (ref 100–199)
HDL: 44 mg/dL (ref >39)
LDL Calculated: 129 mg/dL — ABNORMAL HIGH (ref 0–99)
TRIGLYCERIDES: 181 mg/dL — AB (ref 0–149)
VLDL CHOLESTEROL CAL: 36 mg/dL (ref 5–40)

## 2016-08-29 LAB — PSA: Prostate Specific Ag, Serum: 0.7 ng/mL (ref 0.0–4.0)

## 2016-08-29 LAB — HEMOGLOBIN A1C
ESTIMATED AVERAGE GLUCOSE: 114 mg/dL
HEMOGLOBIN A1C: 5.6 % (ref 4.8–5.6)

## 2016-08-29 LAB — HIV ANTIBODY (ROUTINE TESTING W REFLEX): HIV Screen 4th Generation wRfx: NONREACTIVE

## 2016-10-13 ENCOUNTER — Encounter: Payer: Self-pay | Admitting: Emergency Medicine

## 2016-10-13 ENCOUNTER — Ambulatory Visit (INDEPENDENT_AMBULATORY_CARE_PROVIDER_SITE_OTHER): Payer: 59 | Admitting: Emergency Medicine

## 2016-10-13 VITALS — BP 133/78 | HR 65 | Temp 98.8°F | Resp 16 | Ht 65.75 in | Wt 165.0 lb

## 2016-10-13 DIAGNOSIS — R0981 Nasal congestion: Secondary | ICD-10-CM

## 2016-10-13 DIAGNOSIS — R748 Abnormal levels of other serum enzymes: Secondary | ICD-10-CM

## 2016-10-13 DIAGNOSIS — J0100 Acute maxillary sinusitis, unspecified: Secondary | ICD-10-CM | POA: Diagnosis not present

## 2016-10-13 MED ORDER — AMOXICILLIN-POT CLAVULANATE 875-125 MG PO TABS: 1.0000 | ORAL_TABLET | Freq: Two times a day (BID) | ORAL | 0 refills | Status: DC

## 2016-10-13 MED ORDER — DICLOFENAC SODIUM 75 MG PO TBEC: 75.0000 mg | DELAYED_RELEASE_TABLET | Freq: Two times a day (BID) | ORAL | 0 refills | Status: AC

## 2016-10-13 NOTE — Assessment & Plan Note (Signed)
Chronic; Ct scans reviewed.

## 2016-10-13 NOTE — Patient Instructions (Addendum)
     IF you received an x-ray today, you will receive an invoice from [facility]. Please contact [facility] at [phone] with questions or concerns regarding your invoice.   IF you received labwork today, you will receive an invoice from [facility]. Please contact [facility] at [phone] with questions or concerns regarding your invoice.   Our billing staff will not be able to assist you with questions regarding bills from these companies.  You will be contacted with the lab results as soon as they are available. The fastest way to get your results is to activate your My Chart account. Instructions are located on the last page of this paperwork. If you have not heard from us regarding the results in 2 weeks, please contact this office.     Sinusitis, Adult Sinusitis is soreness and inflammation of your sinuses. Sinuses are hollow spaces in the bones around your face. They are located:  Around your eyes.  In the middle of your forehead.  Behind your nose.  In your cheekbones.  Your sinuses and nasal passages are lined with a stringy fluid (mucus). Mucus normally drains out of your sinuses. When your nasal tissues get inflamed or swollen, the mucus can get trapped or blocked so air cannot flow through your sinuses. This lets bacteria, viruses, and funguses grow, and that leads to infection. Follow these instructions at home: Medicines  Take, use, or apply over-the-counter and prescription medicines only as told by your doctor. These may include nasal sprays.  If you were prescribed an antibiotic medicine, take it as told by your doctor. Do not stop taking the antibiotic even if you start to feel better. Hydrate and Humidify  Drink enough water to keep your pee (urine) clear or pale yellow.  Use a cool mist humidifier to keep the humidity level in your home above 50%.  Breathe in steam for 10-15 minutes, 3-4 times a day or as told by your doctor. You can do  this in the bathroom while a hot shower is running.  Try not to spend time in cool or dry air. Rest  Rest as much as possible.  Sleep with your head raised (elevated).  Make sure to get enough sleep each night. General instructions  Put a warm, moist washcloth on your face 3-4 times a day or as told by your doctor. This will help with discomfort.  Wash your hands often with soap and water. If there is no soap and water, use hand sanitizer.  Do not smoke. Avoid being around people who are smoking (secondhand smoke).  Keep all follow-up visits as told by your doctor. This is important. Contact a doctor if:  You have a fever.  Your symptoms get worse.  Your symptoms do not get better within 10 days. Get help right away if:  You have a very bad headache.  You cannot stop throwing up (vomiting).  You have pain or swelling around your face or eyes.  You have trouble seeing.  You feel confused.  Your neck is stiff.  You have trouble breathing. This information is not intended to replace advice given to you by your health care provider. Make sure you discuss any questions you have with your health care provider. Document Released: 10/08/2007 Document Revised: 12/16/2015 Document Reviewed: 02/14/2015 Elsevier Interactive Patient Education  2018 Elsevier Inc.  

## 2016-10-13 NOTE — Progress Notes (Signed)
John Wall 56 y.o.   Chief Complaint  Patient presents with  . Sore Throat    x 3 weeks   . Cough    green drainage from throat  . Ear Pain    x 1 week  . Nasal Congestion    over eyebrow and cheek area    HISTORY OF PRESENT ILLNESS: This is a 56 y.o. male complaining of 2-3 week h/o sore throat and sinus congestin with ear pain and dizziness worse with head movement and positioning.  Sinus Problem  This is a new problem. The current episode started 1 to 4 weeks ago. The problem has been gradually worsening since onset. There has been no fever. The pain is moderate. Associated symptoms include congestion, coughing, ear pain, sinus pressure, a sore throat and swollen glands. Pertinent negatives include no chills, headaches, neck pain or shortness of breath. Treatments tried: ear drops.     Prior to Admission medications   Medication Sig Start Date End Date Taking? Authorizing Provider  amLODipine (NORVASC) 5 MG tablet Take 1 tablet (5 mg total) by mouth daily. 08/27/16  Yes Seab Axel, Ines Bloomer, MD  GuaiFENesin (TUSSIN PO) Take by mouth.   Yes [provider]  Multiple Vitamin (MULTIVITAMIN) tablet Take 1 tablet by mouth daily.   Yes [provider]    Allergies  Allergen Reactions  . Shrimp [Shellfish Allergy] Shortness Of Breath and Swelling  . Sulfa Antibiotics Rash    Patient Active Problem List   Diagnosis Date Noted  . Abnormal liver enzymes-2017 07/08/2015  . HTN (hypertension) 02/03/2013  . Chest pain 02/03/2013  . Murmur 02/03/2013    Past Medical History:  Diagnosis Date  . Allergy   . Arthritis    hands  . Hypertension   . Kidney stone 2012  . Plantar fasciitis    left foot    Past Surgical History:  Procedure Laterality Date  . APPENDECTOMY  1991  . KIDNEY STONE SURGERY  2012  . LIPOMA EXCISION  2004   abdominal wall LEFT  . RHINOPLASTY  1977  . TENDON REPAIR Left 2002   ankle  . TESTICLE SURGERY  1998    Social  History   Social History  . Marital status: Married    Spouse name: N/A  . Number of children: N/A  . Years of education: N/A   Occupational History  . Not on file.   Social History Main Topics  . Smoking status: Never Smoker  . Smokeless tobacco: Never Used  . Alcohol use 1.2 oz/week    2 Standard drinks or equivalent per week  . Drug use: Unknown  . Sexual activity: Not on file   Other Topics Concern  . Not on file   Social History Narrative  . No narrative on file    Family History  Problem Relation Age of Onset  . Heart attack Father   . Hypertension Mother   . Colon cancer Neg Hx      Review of Systems  Constitutional: Negative for chills, fever and malaise/fatigue.  HENT: Positive for congestion, ear pain, sinus pain, sinus pressure and sore throat. Negative for nosebleeds.   Eyes: Negative for blurred vision, double vision, discharge and redness.  Respiratory: Positive for cough. Negative for shortness of breath and wheezing.   Cardiovascular: Negative for chest pain, palpitations and leg swelling.  Gastrointestinal: Negative for abdominal pain, diarrhea, nausea and vomiting.  Musculoskeletal: Negative for back pain, myalgias and neck pain.  Skin: Negative for  rash.  Neurological: Negative for dizziness, sensory change, focal weakness and headaches.  Endo/Heme/Allergies: Negative.   All other systems reviewed and are negative.  Vitals:   10/13/16 0807  BP: 133/78  Pulse: 65  Resp: 16  Temp: 98.8 F (37.1 C)     Physical Exam  Constitutional: He is oriented to person, place, and time. He appears well-developed and well-nourished.  HENT:  Head: Normocephalic and atraumatic.  Right Ear: Tympanic membrane, external ear and ear canal normal.  Left Ear: Tympanic membrane, external ear and ear canal normal.  Nose: Mucosal edema present. Right sinus exhibits maxillary sinus tenderness. Left sinus exhibits maxillary sinus tenderness.  Mouth/Throat:  Oropharynx is clear and moist. No oropharyngeal exudate.  Eyes: Conjunctivae and EOM are normal. Pupils are equal, round, and reactive to light.  Neck: Normal range of motion. Neck supple. No JVD present. No thyromegaly present.  Cardiovascular: Normal rate, regular rhythm, normal heart sounds and intact distal pulses.   Pulmonary/Chest: Effort normal and breath sounds normal.  Abdominal: Soft. Bowel sounds are normal. He exhibits no distension. There is no tenderness.  Musculoskeletal: Normal range of motion.  Lymphadenopathy:    He has no cervical adenopathy.  Neurological: He is alert and oriented to person, place, and time. No sensory deficit. He exhibits normal muscle tone.  Skin: Skin is warm and dry. Capillary refill takes less than 2 seconds. No rash noted.  Psychiatric: He has a normal mood and affect. His behavior is normal.  Vitals reviewed.    ASSESSMENT & PLAN: John Wall was seen today for sore throat, cough, ear pain and nasal congestion.  Diagnoses and all orders for this visit:  Acute non-recurrent maxillary sinusitis  Sinus congestion  Other orders -     amoxicillin-clavulanate (AUGMENTIN) 875-125 MG tablet; Take 1 tablet by mouth 2 (two) times daily. -     diclofenac (VOLTAREN) 75 MG EC tablet; Take 1 tablet (75 mg total) by mouth 2 (two) times daily.    Patient Instructions       IF you received an x-ray today, you will receive an invoice from [facility]. Please contact [facility] at [phone] with questions or concerns regarding your invoice.   IF you received labwork today, you will receive an invoice from [facility]. Please contact [facility] at [phone] with questions or concerns regarding your invoice.   Our billing staff will not be able to assist you with questions regarding bills from these companies.  You will be contacted with the lab results as soon as they are available. The fastest way to get your results is to activate  your My Chart account. Instructions are located on the last page of this paperwork. If you have not heard from [place] regarding the results in 2 weeks, please contact this office.     Sinusitis, Adult Sinusitis is soreness and inflammation of your sinuses. Sinuses are hollow spaces in the bones around your face. They are located:  Around your eyes.  In the middle of your forehead.  Behind your nose.  In your cheekbones.  Your sinuses and nasal passages are lined with a stringy fluid (mucus). Mucus normally drains out of your sinuses. When your nasal tissues get inflamed or swollen, the mucus can get trapped or blocked so air cannot flow through your sinuses. This lets bacteria, viruses, and funguses grow, and that leads to infection. Follow these instructions at home: Medicines  Take, use, or apply over-the-counter and prescription medicines only as told by your doctor. These  may include nasal sprays.  If you were prescribed an antibiotic medicine, take it as told by your doctor. Do not stop taking the antibiotic even if you start to feel better. Hydrate and Humidify  Drink enough water to keep your pee (urine) clear or pale yellow.  Use a cool mist humidifier to keep the humidity level in your home above 50%.  Breathe in steam for 10-15 minutes, 3-4 times a day or as told by your doctor. You can do this in the bathroom while a hot shower is running.  Try not to spend time in cool or dry air. Rest  Rest as much as possible.  Sleep with your head raised (elevated).  Make sure to get enough sleep each night. General instructions  Put a warm, moist washcloth on your face 3-4 times a day or as told by your doctor. This will help with discomfort.  Wash your hands often with soap and water. If there is no soap and water, use hand sanitizer.  Do not smoke. Avoid being around people who are smoking (secondhand smoke).  Keep all follow-up visits as told by your doctor. This is  important. Contact a doctor if:  You have a fever.  Your symptoms get worse.  Your symptoms do not get better within 10 days. Get help right away if:  You have a very bad headache.  You cannot stop throwing up (vomiting).  You have pain or swelling around your face or eyes.  You have trouble seeing.  You feel confused.  Your neck is stiff.  You have trouble breathing. This information is not intended to replace advice given to you by your health care provider. Make sure you discuss any questions you have with your health care provider. Document Released: 10/08/2007 Document Revised: 12/16/2015 Document Reviewed: 02/14/2015 Elsevier Interactive Patient Education  2018 Elsevier Inc.      Agustina Caroli, MD [facility]

## 2016-10-14 ENCOUNTER — Telehealth: Payer: Self-pay

## 2016-10-14 NOTE — Telephone Encounter (Signed)
Pt is needing dr Mitchel Honour to call him back regarding his reaction to the medication he is having side effects   Best number [phone]

## 2016-10-14 NOTE — Telephone Encounter (Signed)
Called the patient, he stated that the reaction to antibiotic was Diarrhea. He has severe diarrhea, after the first pill he took, and he stopped the med. He also has a severe abdominal pain. He want you to tell him what to do?"He has to go to work, and he can't go like this every 30 min. to go to the bathroom. " Please call him. Thank you.

## 2016-10-15 NOTE — Telephone Encounter (Signed)
Called today, no answer, left a message.

## 2017-08-16 ENCOUNTER — Other Ambulatory Visit: Payer: Self-pay | Admitting: Emergency Medicine

## 2017-08-17 NOTE — Telephone Encounter (Signed)
Patient called, left detailed VM to call the office to schedule a yearly physical appointment with Dr. Mitchel Honour in order to get more refills on the amlodipine, a 30 supply is being filled, but an appointment is needed before more refills.

## 2017-09-03 IMAGING — CT CT ABD-PELV W/ CM
2 of 5 series · 15 of 46 positions shown, 17 images · IV contrast (APPLIED)
Comparison: Ultrasound 07/18/2015

ADDENDUM:
Voice recognition error in the last sentence of the impression. This
should read

These remain nonspecific based on imaging characteristics. Recommend
additional characterization using liver protocol MRI.
Unenhanced [REDACTED] Urology CT abdomen pelvis dated 12/22/2008 has
been loaded for comparison.
A 2.4 cm hypodense lesion is present in the left hepatic dome on
that study (series 2/image 6), corresponding to one of the
hypervascular lesions on the current study. Additionally, a 2.5 cm
hypodense lesion is present in the posterior segment right hepatic
dome medially (series 2/image 9), corresponding to a 2nd
hypervascular lesion on the current study.
Additionally, there is a stable lobular contour along the
gallbladder fossa (series 2/image 20), likely corresponding to a 3rd
subtle lesion on the current CT, and accounting for the 3rd
abnormality on recent ultrasound.
Given 5 year stability, these can be considered benign. The two dome
lesions likely reflect hemangiomas, while the gallbladder fossa
lesion likely reflects a sclerosing hemangioma with overlying
capsular retraction.
Dedicated MR imaging is not considered necessary.
CLINICAL DATA: Abnormal liver ultrasound. Liver lesion. Elevated
LFTs.
EXAM:
CT ABDOMEN AND PELVIS WITH CONTRAST
TECHNIQUE: Multidetector CT imaging of the abdomen and pelvis was performed
using the standard protocol following bolus administration of
intravenous contrast.
CONTRAST:  100mL [id number] IOPAMIDOL ([id number]) INJECTION 61%

[Series 2: abd/pelvis w/cm · axial · 0.71mm/px · z∈[+584,+994]mm · 12 of 92 slices shown, 14 images]
[im 5/92  soft-tissue]
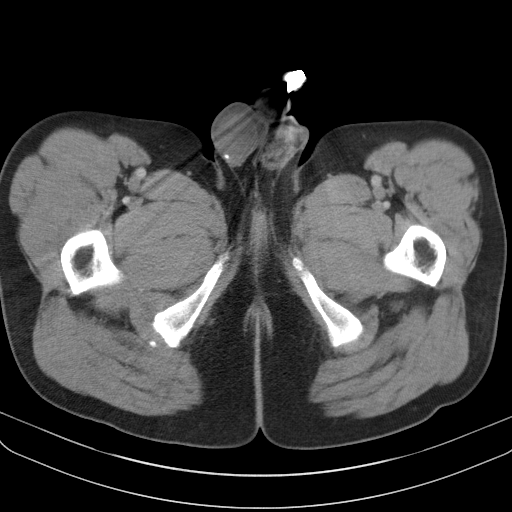
[im 5/92  bone]
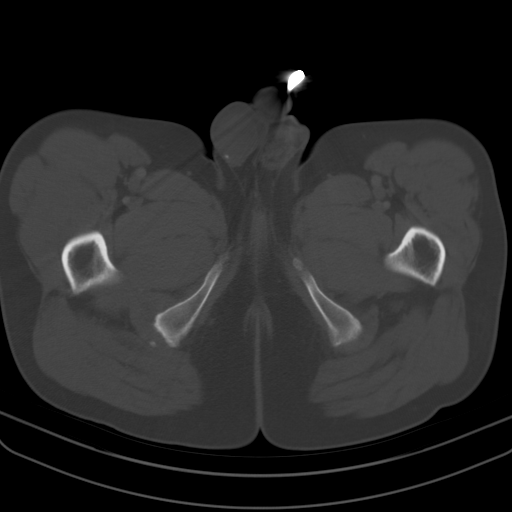
[im 15/92  soft-tissue]
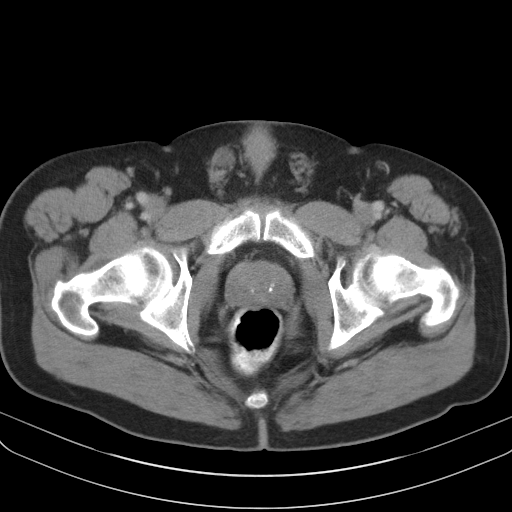
[im 20/92  soft-tissue]
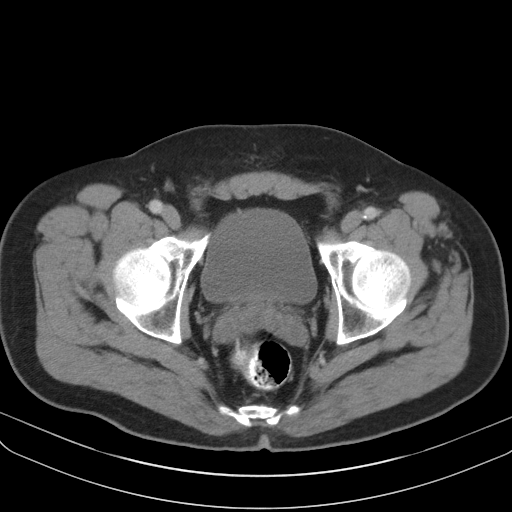
[im 29/92  soft-tissue]
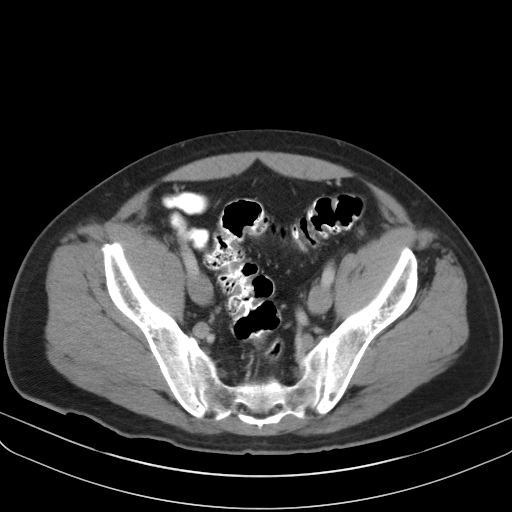
[im 34/92  soft-tissue]
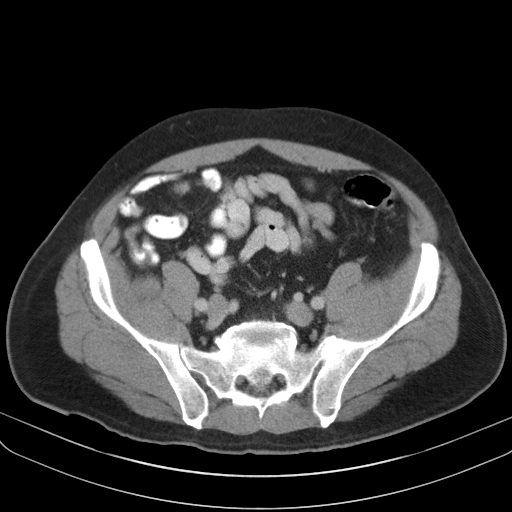
[im 44/92  soft-tissue]
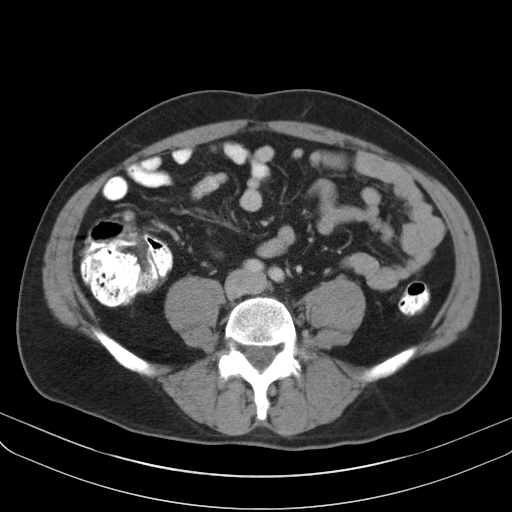
[im 48/92  soft-tissue]
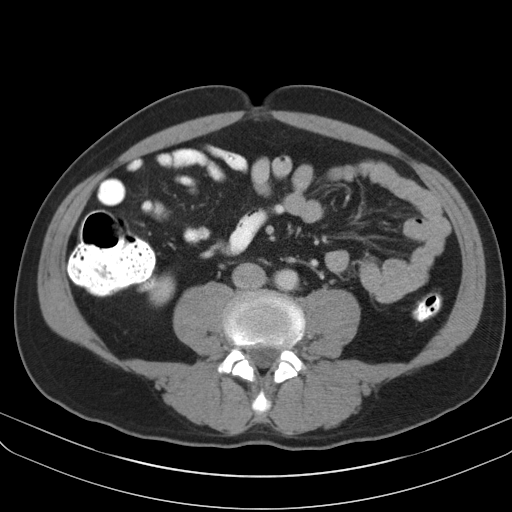
[im 58/92  soft-tissue]
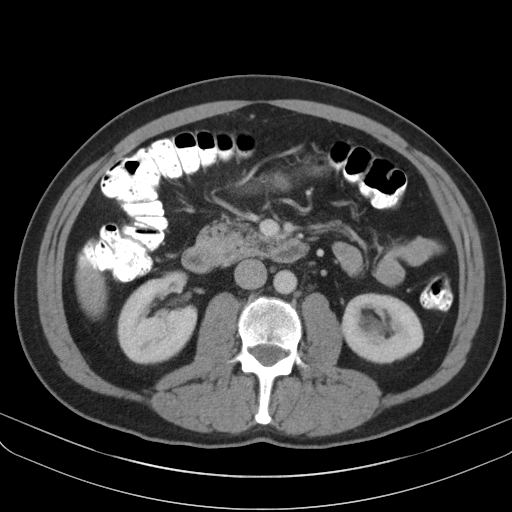
[im 63/92  soft-tissue]
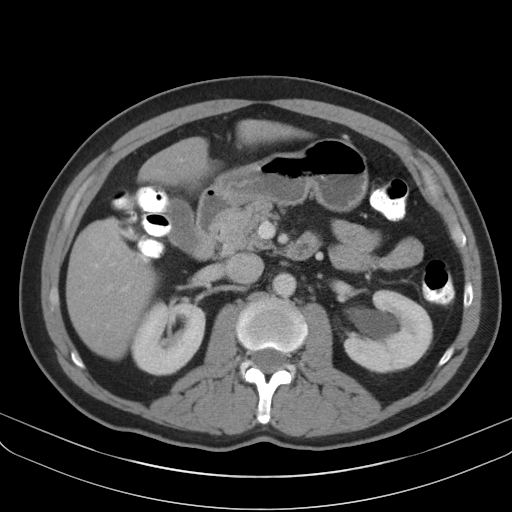
[im 63/92  bone]
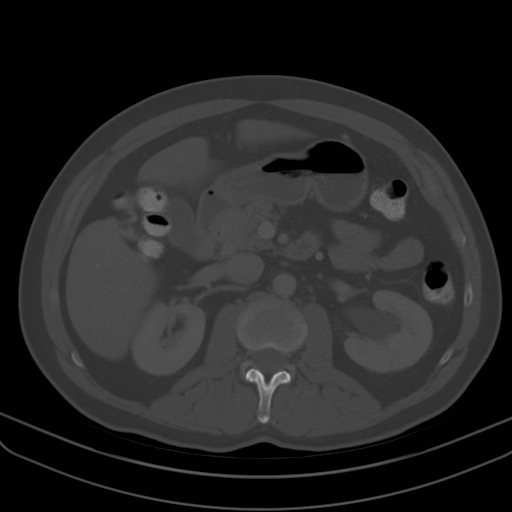
[im 72/92  soft-tissue]
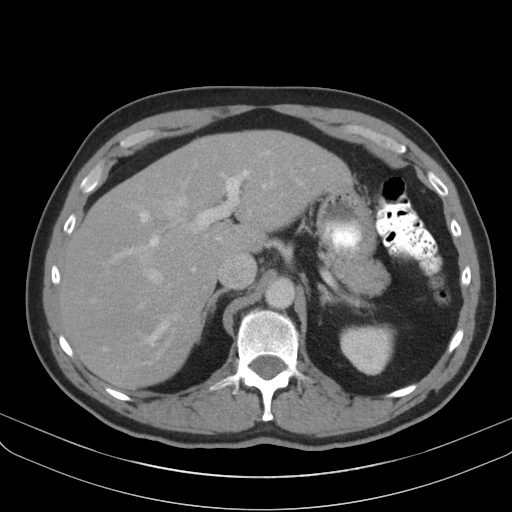
[im 77/92  soft-tissue]
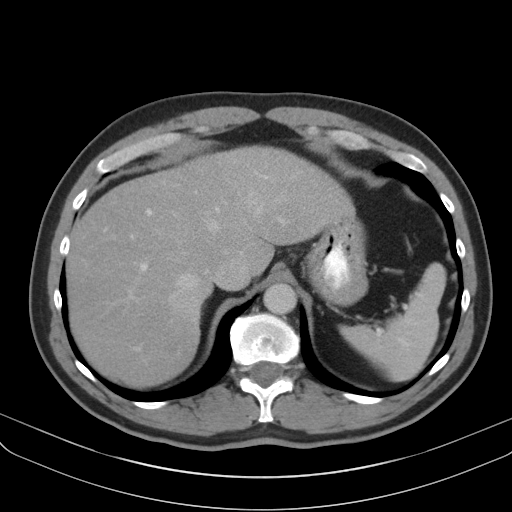
[im 87/92  soft-tissue]
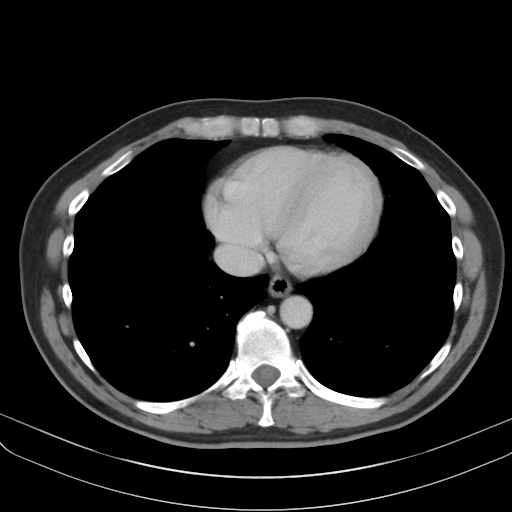

[Series 3: cor · coronal · 0.70mm/px · 3 of 84 slices shown]
[im 28/84  soft-tissue]
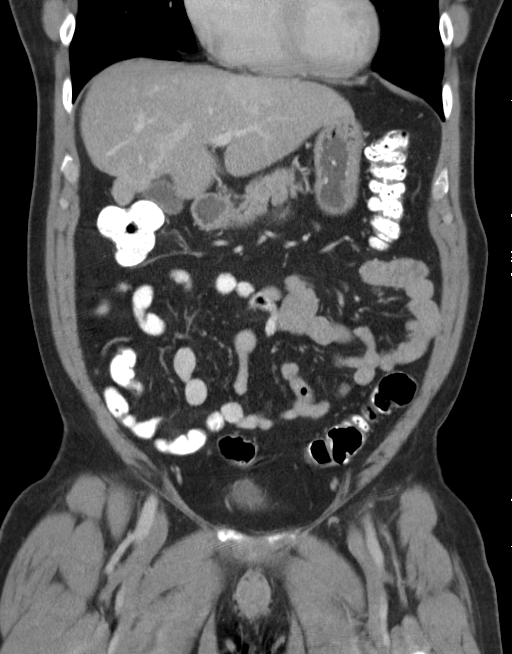
[im 37/84  soft-tissue]
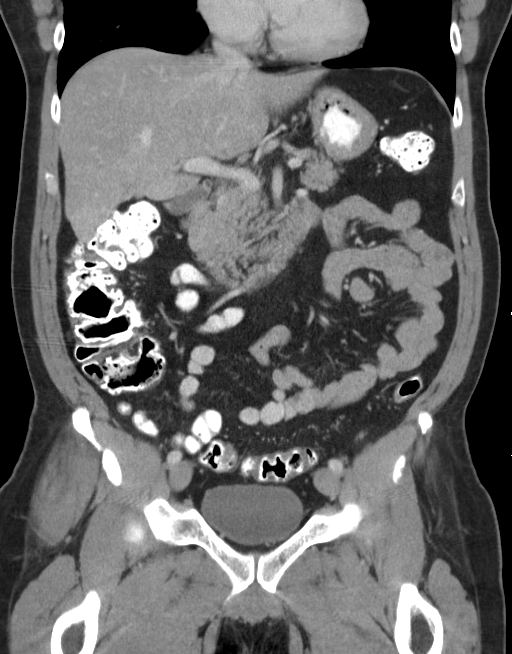
[im 47/84  soft-tissue]
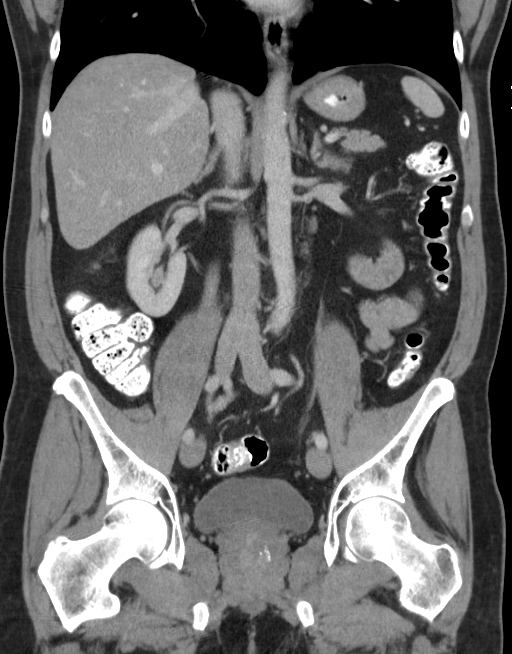

[15 of 46 positions shown; findings below may reference images not displayed]

FINDINGS: Lower chest: Lung bases are clear. No effusions. Heart is normal
size.

Hepatobiliary: Two slightly hyperintense, likely enhancing, areas
noted within the liver, both seen best on image 14 measuring 2.6 cm
in the left hepatic lobe and 2.9 cm in the right hepatic lobe. These
are difficult to characterize on this portal venous phase only and
require MRI for additional characterization. Gallbladder
unremarkable.

Pancreas: Normal appearance

Spleen: Normal appearance

Adrenals/Urinary Tract: Adrenal glands and bladder are unremarkable.
Parapelvic cysts within the left kidney. No hydronephrosis or renal
lesion.

Stomach/Bowel: Stomach, large and small bowel grossly unremarkable.

Vascular/Lymphatic: No evidence of aneurysm or adenopathy.

Reproductive: Prostate calcifications. No visible significant
abnormality.

Other: No free fluid or free air

Musculoskeletal: No acute bony abnormality or focal bone lesion.
IMPRESSION: Two subtle hyperdense lesions noted within the liver, likely
enhancing lesions. These remain nonspecific based on imaging
characteristics Xiang car additional characterization using liver
protocol MRI.

## 2017-09-22 ENCOUNTER — Ambulatory Visit (INDEPENDENT_AMBULATORY_CARE_PROVIDER_SITE_OTHER): Payer: 59 | Admitting: Emergency Medicine

## 2017-09-22 ENCOUNTER — Encounter: Payer: Self-pay | Admitting: Emergency Medicine

## 2017-09-22 ENCOUNTER — Other Ambulatory Visit: Payer: Self-pay

## 2017-09-22 VITALS — BP 128/76 | HR 81 | Temp 98.9°F | Resp 16 | Ht 66.0 in | Wt 162.6 lb

## 2017-09-22 DIAGNOSIS — Z0000 Encounter for general adult medical examination without abnormal findings: Secondary | ICD-10-CM | POA: Diagnosis not present

## 2017-09-22 DIAGNOSIS — I10 Essential (primary) hypertension: Secondary | ICD-10-CM

## 2017-09-22 MED ORDER — MOMETASONE FUROATE 0.1 % EX CREA: 1.0000 "application " | TOPICAL_CREAM | Freq: Every day | CUTANEOUS | 3 refills | Status: DC

## 2017-09-22 MED ORDER — AMLODIPINE BESYLATE 5 MG PO TABS: 5.0000 mg | ORAL_TABLET | Freq: Every day | ORAL | 3 refills | Status: DC

## 2017-09-22 NOTE — Progress Notes (Signed)
John Wall 56 y.o.   Chief Complaint  Patient presents with  . Annual Exam  . Medication Refill    amlodipine    HISTORY OF PRESENT ILLNESS: This is a 57 y.o. male here for annual exam.  Has a history of hypertension.  Controlled on amlodipine.  Exercises regularly and eats well.  Healthy lifestyle.  Has occasional pains to his joints.  Mostly in the morning, improves with activity, and gets better with ibuprofen.  No other complaints or medical concerns.  Has a history of seasonal allergies.  HPI   Prior to Admission medications   Medication Sig Start Date End Date Taking? Authorizing Provider  amLODipine (NORVASC) 5 MG tablet TAKE 1 TABLET (5 MG TOTAL) BY MOUTH DAILY. 08/17/17  Yes John Wall, John Bloomer, MD  GuaiFENesin (TUSSIN PO) Take by mouth.   Yes [provider]  Ketotifen Fumarate (ZADITOR OP) Apply to eye as needed.   Yes [provider]  Multiple Vitamin (MULTIVITAMIN) tablet Take 1 tablet by mouth daily.   Yes [provider]  OVER THE COUNTER MEDICATION as needed.   Yes [provider]    Allergies  Allergen Reactions  . Shrimp [Shellfish Allergy] Shortness Of Breath and Swelling  . Sulfa Antibiotics Rash    Patient Active Problem List   Diagnosis Date Noted  . Acute non-recurrent maxillary sinusitis 10/13/2016  . Sinus congestion 10/13/2016  . Abnormal liver enzymes-2017 07/08/2015  . HTN (hypertension) 02/03/2013  . Chest pain 02/03/2013  . Murmur 02/03/2013    Past Medical History:  Diagnosis Date  . Allergy   . Arthritis    hands  . Hypertension   . Kidney stone 2012  . Plantar fasciitis    left foot    Past Surgical History:  Procedure Laterality Date  . APPENDECTOMY  1991  . KIDNEY STONE SURGERY  2012  . LIPOMA EXCISION  2004   abdominal wall LEFT  . RHINOPLASTY  1977  . TENDON REPAIR Left 2002   ankle  . TESTICLE SURGERY  1998    Social History   Socioeconomic History  . Marital status:  Married    Spouse name: Not on file  . Number of children: Not on file  . Years of education: Not on file  . Highest education level: Not on file  Occupational History  . Not on file  Social Needs  . Financial resource strain: Not on file  . Food insecurity:    Worry: Not on file    Inability: Not on file  . Transportation needs:    Medical: Not on file    Non-medical: Not on file  Tobacco Use  . Smoking status: Never Smoker  . Smokeless tobacco: Never Used  Substance and Sexual Activity  . Alcohol use: Yes    Alcohol/week: 1.2 oz    Types: 2 Standard drinks or equivalent per week  . Drug use: Not on file  . Sexual activity: Not on file  Lifestyle  . Physical activity:    Days per week: Not on file    Minutes per session: Not on file  . Stress: Not on file  Relationships  . Social connections:    Talks on phone: Not on file    Gets together: Not on file    Attends religious service: Not on file    Active member of club or organization: Not on file    Attends meetings of clubs or organizations: Not on file    Relationship status:  Not on file  . Intimate partner violence:    Fear of current or ex partner: Not on file    Emotionally abused: Not on file    Physically abused: Not on file    Forced sexual activity: Not on file  Other Topics Concern  . Not on file  Social History Narrative  . Not on file    Family History  Problem Relation Age of Onset  . Heart attack Father   . Hypertension Mother   . Colon cancer Neg Hx      Review of Systems  Constitutional: Negative.  Negative for chills, fever and weight loss.  HENT: Positive for congestion and nosebleeds. Negative for hearing loss and sore throat.   Eyes: Negative.  Negative for double vision.  Respiratory: Negative.  Negative for cough, hemoptysis and shortness of breath.   Cardiovascular: Negative.  Negative for chest pain and palpitations.  Gastrointestinal: Negative.  Negative for abdominal pain,  diarrhea, nausea and vomiting.  Genitourinary: Negative.  Negative for dysuria and hematuria.  Musculoskeletal: Positive for joint pain.  Skin: Negative.  Negative for rash.  Neurological: Negative.  Negative for dizziness and headaches.  Endo/Heme/Allergies: Positive for environmental allergies.  All other systems reviewed and are negative.   Vitals:   09/22/17 0847  BP: 128/76  Pulse: 81  Resp: 16  Temp: 98.9 F (37.2 C)  SpO2: 97%    Physical Exam  Constitutional: He is oriented to person, place, and time. He appears well-developed and well-nourished.  HENT:  Head: Normocephalic and atraumatic.  Nose: Nose normal.  Mouth/Throat: Oropharynx is clear and moist.  Eyes: Pupils are equal, round, and reactive to light. Conjunctivae and EOM are normal.  Neck: Normal range of motion. Neck supple. No JVD present. No thyromegaly present.  Cardiovascular: Normal rate, regular rhythm and normal heart sounds.  Pulmonary/Chest: Effort normal and breath sounds normal.  Abdominal: Soft. Bowel sounds are normal. He exhibits no distension. There is no tenderness.  Musculoskeletal: Normal range of motion. He exhibits no edema, tenderness or deformity.  Lymphadenopathy:    He has no cervical adenopathy.  Neurological: He is alert and oriented to person, place, and time. No sensory deficit. He exhibits normal muscle tone.  Skin: Skin is warm and dry. Capillary refill takes less than 2 seconds.  Psychiatric: He has a normal mood and affect. His behavior is normal.  Vitals reviewed.    ASSESSMENT & PLAN: John Wall was seen today for annual exam and medication refill.  Diagnoses and all orders for this visit:  Routine general medical examination at a health care facility -     CBC with Differential -     Comprehensive metabolic panel -     Hemoglobin A1c -     Lipid panel -     PSA(Must document that pt has been informed of limitations of PSA testing.) -     mometasone (ELOCON) 0.1 %  cream; Apply 1 application topically daily.  Essential hypertension -     amLODipine (NORVASC) 5 MG tablet; Take 1 tablet (5 mg total) by mouth daily.    Patient Instructions       IF you received an x-ray today, you will receive an invoice from [facility]. Please contact [facility] at [phone] with questions or concerns regarding your invoice.   IF you received labwork today, you will receive an invoice from [place]. Please contact [facility] at [phone] with questions or concerns regarding your invoice.   Our billing staff  will not be able to assist you with questions regarding bills from these companies.  You will be contacted with the lab results as soon as they are available. The fastest way to get your results is to activate your My Chart account. Instructions are located on the last page of this paperwork. If you have not heard from [place] regarding the results in 2 weeks, please contact this office.      Health Maintenance, Male A healthy lifestyle and preventive care is important for your health and wellness. Ask your health care provider about what schedule of regular examinations is right for you. What should I know about weight and diet? Eat a Healthy Diet  Eat plenty of vegetables, fruits, whole grains, low-fat dairy products, and lean protein.  Do not eat a lot of foods high in solid fats, added sugars, or salt.  Maintain a Healthy Weight Regular exercise can help you achieve or maintain a healthy weight. You should:  Do at least 150 minutes of exercise each week. The exercise should increase your heart rate and make you sweat (moderate-intensity exercise).  Do strength-training exercises at least twice a week.  Watch Your Levels of Cholesterol and Blood Lipids  Have your blood tested for lipids and cholesterol every 5 years starting at 59 years of age. If you are at high risk for heart disease, you should start having your blood tested  when you are 59 years old. You may need to have your cholesterol levels checked more often if: ? Your lipid or cholesterol levels are high. ? You are older than 59 years of age. ? You are at high risk for heart disease.  What should I know about cancer screening? Many types of cancers can be detected early and may often be prevented. Lung Cancer  You should be screened every year for lung cancer if: ? You are a current smoker who has smoked for at least 30 years. ? You are a former smoker who has quit within the past 15 years.  Talk to your health care provider about your screening options, when you should start screening, and how often you should be screened.  Colorectal Cancer  Routine colorectal cancer screening usually begins at 59 years of age and should be repeated every 5-10 years until you are 59 years old. You may need to be screened more often if early forms of precancerous polyps or small growths are found. Your health care provider may recommend screening at an earlier age if you have risk factors for colon cancer.  Your health care provider may recommend using home test kits to check for hidden blood in the stool.  A small camera at the end of a tube can be used to examine your colon (sigmoidoscopy or colonoscopy). This checks for the earliest forms of colorectal cancer.  Prostate and Testicular Cancer  Depending on your age and overall health, your health care provider may do certain tests to screen for prostate and testicular cancer.  Talk to your health care provider about any symptoms or concerns you have about testicular or prostate cancer.  Skin Cancer  Check your skin from head to toe regularly.  Tell your health care provider about any new moles or changes in moles, especially if: ? There is a change in a mole's size, shape, or color. ? You have a mole that is larger than a pencil eraser.  Always use sunscreen. Apply sunscreen liberally and repeat throughout  the day.  Protect yourself  by wearing long sleeves, pants, a wide-brimmed hat, and sunglasses when outside.  What should I know about heart disease, diabetes, and high blood pressure?  If you are 12-45 years of age, have your blood pressure checked every 3-5 years. If you are 57 years of age or older, have your blood pressure checked every year. You should have your blood pressure measured twice-once when you are at a hospital or clinic, and once when you are not at a hospital or clinic. Record the average of the two measurements. To check your blood pressure when you are not at a hospital or clinic, you can use: ? An automated blood pressure machine at a pharmacy. ? A home blood pressure monitor.  Talk to your health care provider about your target blood pressure.  If you are between 72-64 years old, ask your health care provider if you should take aspirin to prevent heart disease.  Have regular diabetes screenings by checking your fasting blood sugar level. ? If you are at a normal weight and have a low risk for diabetes, have this test once every three years after the age of 59. ? If you are overweight and have a high risk for diabetes, consider being tested at a younger age or more often.  A one-time screening for abdominal aortic aneurysm (AAA) by ultrasound is recommended for men aged 77-75 years who are current or former smokers. What should I know about preventing infection? Hepatitis B If you have a higher risk for hepatitis B, you should be screened for this virus. Talk with your health care provider to find out if you are at risk for hepatitis B infection. Hepatitis C Blood testing is recommended for:  Everyone born from [birth year] through 1967.  Anyone with known risk factors for hepatitis C.  Sexually Transmitted Diseases (STDs)  You should be screened each year for STDs including gonorrhea and chlamydia if: ? You are sexually active and are younger than 59 years of age. ? You  are older than 59 years of age and your health care provider tells you that you are at risk for this type of infection. ? Your sexual activity has changed since you were last screened and you are at an increased risk for chlamydia or gonorrhea. Ask your health care provider if you are at risk.  Talk with your health care provider about whether you are at high risk of being infected with HIV. Your health care provider may recommend a prescription medicine to help prevent HIV infection.  What else can I do?  Schedule regular health, dental, and eye exams.  Stay current with your vaccines (immunizations).  Do not use any tobacco products, such as cigarettes, chewing tobacco, and e-cigarettes. If you need help quitting, ask your health care provider.  Limit alcohol intake to no more than 2 drinks per day. One drink equals 12 ounces of beer, 5 ounces of wine, or 1 ounces of hard liquor.  Do not use street drugs.  Do not share needles.  Ask your health care provider for help if you need support or information about quitting drugs.  Tell your health care provider if you often feel depressed.  Tell your health care provider if you have ever been abused or do not feel safe at home. This information is not intended to replace advice given to you by your health care provider. Make sure you discuss any questions you have with your health care provider. Document Released: 10/18/2007 Document Revised: 12/19/2015 Document  Reviewed: 01/23/2015 Elsevier Interactive Patient Education  2018 [place] [employer] Association (AHA) Exercise Recommendation  Being physically active is important to prevent heart disease and stroke, the nation's No. 1and No. 5killers. To improve overall cardiovascular health, we suggest at least 150 minutes per week of moderate exercise or 75 minutes per week of vigorous exercise (or a combination of moderate and vigorous activity). Thirty minutes a day, five times a  week is an easy goal to remember. You will also experience benefits even if you divide your time into two or three segments of 10 to 15 minutes per day.  For people who would benefit from lowering their blood pressure or cholesterol, we recommend 40 minutes of aerobic exercise of moderate to vigorous intensity three to four times a week to lower the risk for heart attack and stroke.  Physical activity is anything that makes you move your body and burn calories.  This includes things like climbing stairs or playing sports. Aerobic exercises benefit your heart, and include walking, jogging, swimming or biking. Strength and stretching exercises are best for overall stamina and flexibility.  The simplest, positive change you can make to effectively improve your heart health is to start walking. It's enjoyable, free, easy, social and great exercise. A walking program is flexible and boasts high success rates because people can stick with it. It's easy for walking to become a regular and satisfying part of life.   For Overall Cardiovascular Health:  At least 30 minutes of moderate-intensity aerobic activity at least 5 days per week for a total of 150  OR   At least 25 minutes of vigorous aerobic activity at least 3 days per week for a total of 75 minutes; or a combination of moderate- and vigorous-intensity aerobic activity  AND   Moderate- to high-intensity muscle-strengthening activity at least 2 days per week for additional health benefits.  For Lowering Blood Pressure and Cholesterol  An average 40 minutes of moderate- to vigorous-intensity aerobic activity 3 or 4 times per week  What if I can't make it to the time goal? Something is always better than nothing! And everyone has to start somewhere. Even if you've been sedentary for years, today is the day you can begin to make healthy changes in your life. If you don't think you'll make it for 30 or 40 minutes, set a reachable goal for  today. You can work up toward your overall goal by increasing your time as you get stronger. Don't let all-or-nothing thinking rob you of doing what you can every day.  Source:[email or web address].[email or web address] Pop, MD [facility]

## 2017-09-22 NOTE — Patient Instructions (Addendum)
   IF you received an x-ray today, you will receive an invoice from [facility]. Please contact  Radiology at [phone] with questions or concerns regarding your invoice.   IF you received labwork today, you will receive an invoice from [facility]. Please contact [facility] at [phone] with questions or concerns regarding your invoice.   Our billing staff will not be able to assist you with questions regarding bills from these companies.  You will be contacted with the lab results as soon as they are available. The fastest way to get your results is to activate your My Chart account. Instructions are located on the last page of this paperwork. If you have not heard from us regarding the results in 2 weeks, please contact this office.      Health Maintenance, Male A healthy lifestyle and preventive care is important for your health and wellness. Ask your health care provider about what schedule of regular examinations is right for you. What should I know about weight and diet? Eat a Healthy Diet  Eat plenty of vegetables, fruits, whole grains, low-fat dairy products, and lean protein.  Do not eat a lot of foods high in solid fats, added sugars, or salt.  Maintain a Healthy Weight Regular exercise can help you achieve or maintain a healthy weight. You should:  Do at least 150 minutes of exercise each week. The exercise should increase your heart rate and make you sweat (moderate-intensity exercise).  Do strength-training exercises at least twice a week.  Watch Your Levels of Cholesterol and Blood Lipids  Have your blood tested for lipids and cholesterol every 5 years starting at 59 years of age. If you are at high risk for heart disease, you should start having your blood tested when you are 59 years old. You may need to have your cholesterol levels checked more often if: ? Your lipid or cholesterol levels are high. ? You are older than 59 years of age. ? You  are at high risk for heart disease.  What should I know about cancer screening? Many types of cancers can be detected early and may often be prevented. Lung Cancer  You should be screened every year for lung cancer if: ? You are a current smoker who has smoked for at least 30 years. ? You are a former smoker who has quit within the past 15 years.  Talk to your health care provider about your screening options, when you should start screening, and how often you should be screened.  Colorectal Cancer  Routine colorectal cancer screening usually begins at 59 years of age and should be repeated every 5-10 years until you are 59 years old. You may need to be screened more often if early forms of precancerous polyps or small growths are found. Your health care provider may recommend screening at an earlier age if you have risk factors for colon cancer.  Your health care provider may recommend using home test kits to check for hidden blood in the stool.  A small camera at the end of a tube can be used to examine your colon (sigmoidoscopy or colonoscopy). This checks for the earliest forms of colorectal cancer.  Prostate and Testicular Cancer  Depending on your age and overall health, your health care provider may do certain tests to screen for prostate and testicular cancer.  Talk to your health care provider about any symptoms or concerns you have about testicular or prostate cancer.  Skin Cancer  Check your skin   from head to toe regularly.  Tell your health care provider about any new moles or changes in moles, especially if: ? There is a change in a mole's size, shape, or color. ? You have a mole that is larger than a pencil eraser.  Always use sunscreen. Apply sunscreen liberally and repeat throughout the day.  Protect yourself by wearing long sleeves, pants, a wide-brimmed hat, and sunglasses when outside.  What should I know about heart disease, diabetes, and high blood  pressure?  If you are 18-39 years of age, have your blood pressure checked every 3-5 years. If you are 40 years of age or older, have your blood pressure checked every year. You should have your blood pressure measured twice-once when you are at a hospital or clinic, and once when you are not at a hospital or clinic. Record the average of the two measurements. To check your blood pressure when you are not at a hospital or clinic, you can use: ? An automated blood pressure machine at a pharmacy. ? A home blood pressure monitor.  Talk to your health care provider about your target blood pressure.  If you are between 45-79 years old, ask your health care provider if you should take aspirin to prevent heart disease.  Have regular diabetes screenings by checking your fasting blood sugar level. ? If you are at a normal weight and have a low risk for diabetes, have this test once every three years after the age of 45. ? If you are overweight and have a high risk for diabetes, consider being tested at a younger age or more often.  A one-time screening for abdominal aortic aneurysm (AAA) by ultrasound is recommended for men aged 65-75 years who are current or former smokers. What should I know about preventing infection? Hepatitis B If you have a higher risk for hepatitis B, you should be screened for this virus. Talk with your health care provider to find out if you are at risk for hepatitis B infection. Hepatitis C Blood testing is recommended for:  Everyone born from 1945 through 1965.  Anyone with known risk factors for hepatitis C.  Sexually Transmitted Diseases (STDs)  You should be screened each year for STDs including gonorrhea and chlamydia if: ? You are sexually active and are younger than 59 years of age. ? You are older than 59 years of age and your health care provider tells you that you are at risk for this type of infection. ? Your sexual activity has changed since you were last  screened and you are at an increased risk for chlamydia or gonorrhea. Ask your health care provider if you are at risk.  Talk with your health care provider about whether you are at high risk of being infected with HIV. Your health care provider may recommend a prescription medicine to help prevent HIV infection.  What else can I do?  Schedule regular health, dental, and eye exams.  Stay current with your vaccines (immunizations).  Do not use any tobacco products, such as cigarettes, chewing tobacco, and e-cigarettes. If you need help quitting, ask your health care provider.  Limit alcohol intake to no more than 2 drinks per day. One drink equals 12 ounces of beer, 5 ounces of wine, or 1 ounces of hard liquor.  Do not use street drugs.  Do not share needles.  Ask your health care provider for help if you need support or information about quitting drugs.  Tell your health care   provider if you often feel depressed.  Tell your health care provider if you have ever been abused or do not feel safe at home. This information is not intended to replace advice given to you by your health care provider. Make sure you discuss any questions you have with your health care provider. Document Released: 10/18/2007 Document Revised: 12/19/2015 Document Reviewed: 01/23/2015 Elsevier Interactive Patient Education  2018 Jay (AHA) Exercise Recommendation  Being physically active is important to prevent heart disease and stroke, the nation's No. 1and No. 5killers. To improve overall cardiovascular health, we suggest at least 150 minutes per week of moderate exercise or 75 minutes per week of vigorous exercise (or a combination of moderate and vigorous activity). Thirty minutes a day, five times a week is an easy goal to remember. You will also experience benefits even if you divide your time into two or three segments of 10 to 15 minutes per day.  For people who would  benefit from lowering their blood pressure or cholesterol, we recommend 40 minutes of aerobic exercise of moderate to vigorous intensity three to four times a week to lower the risk for heart attack and stroke.  Physical activity is anything that makes you move your body and burn calories.  This includes things like climbing stairs or playing sports. Aerobic exercises benefit your heart, and include walking, jogging, swimming or biking. Strength and stretching exercises are best for overall stamina and flexibility.  The simplest, positive change you can make to effectively improve your heart health is to start walking. It's enjoyable, free, easy, social and great exercise. A walking program is flexible and boasts high success rates because people can stick with it. It's easy for walking to become a regular and satisfying part of life.   For Overall Cardiovascular Health:  At least 30 minutes of moderate-intensity aerobic activity at least 5 days per week for a total of 150  OR   At least 25 minutes of vigorous aerobic activity at least 3 days per week for a total of 75 minutes; or a combination of moderate- and vigorous-intensity aerobic activity  AND   Moderate- to high-intensity muscle-strengthening activity at least 2 days per week for additional health benefits.  For Lowering Blood Pressure and Cholesterol  An average 40 minutes of moderate- to vigorous-intensity aerobic activity 3 or 4 times per week  What if I can't make it to the time goal? Something is always better than nothing! And everyone has to start somewhere. Even if you've been sedentary for years, today is the day you can begin to make healthy changes in your life. If you don't think you'll make it for 30 or 40 minutes, set a reachable goal for today. You can work up toward your overall goal by increasing your time as you get stronger. Don't let all-or-nothing thinking rob you of doing what you can every day.   Source:http://www.heart.org

## 2017-09-23 ENCOUNTER — Encounter: Payer: Self-pay | Admitting: *Deleted

## 2017-09-23 LAB — LIPID PANEL
CHOLESTEROL TOTAL: 188 mg/dL (ref 100–199)
Chol/HDL Ratio: 4.2 ratio (ref 0.0–5.0)
HDL: 45 mg/dL (ref >39)
LDL Calculated: 106 mg/dL — ABNORMAL HIGH (ref 0–99)
Triglycerides: 183 mg/dL — ABNORMAL HIGH (ref 0–149)
VLDL Cholesterol Cal: 37 mg/dL (ref 5–40)

## 2017-09-23 LAB — CBC WITH DIFFERENTIAL/PLATELET
BASOS: 0 %
Basophils Absolute: 0 10*3/uL (ref 0.0–0.2)
EOS (ABSOLUTE): 0.4 10*3/uL (ref 0.0–0.4)
EOS: 5 %
HEMATOCRIT: 49.8 % (ref 37.5–51.0)
Hemoglobin: 16.6 g/dL (ref 13.0–17.7)
Immature Grans (Abs): 0 10*3/uL (ref 0.0–0.1)
Immature Granulocytes: 0 %
LYMPHS ABS: 1.4 10*3/uL (ref 0.7–3.1)
Lymphs: 19 %
MCH: 30.9 pg (ref 26.6–33.0)
MCHC: 33.3 g/dL (ref 31.5–35.7)
MCV: 93 fL (ref 79–97)
MONOS ABS: 0.4 10*3/uL (ref 0.1–0.9)
Monocytes: 6 %
NEUTROS PCT: 70 %
Neutrophils Absolute: 5 10*3/uL (ref 1.4–7.0)
PLATELETS: 284 10*3/uL (ref 150–450)
RBC: 5.37 x10E6/uL (ref 4.14–5.80)
RDW: 14.1 % (ref 12.3–15.4)
WBC: 7.2 10*3/uL (ref 3.4–10.8)

## 2017-09-23 LAB — COMPREHENSIVE METABOLIC PANEL
A/G RATIO: 2.1 (ref 1.2–2.2)
ALK PHOS: 91 IU/L (ref 39–117)
ALT: 27 IU/L (ref 0–44)
AST: 28 IU/L (ref 0–40)
Albumin: 4.9 g/dL (ref 3.5–5.5)
BUN/Creatinine Ratio: 17 (ref 9–20)
BUN: 13 mg/dL (ref 6–24)
Bilirubin Total: 0.8 mg/dL (ref 0.0–1.2)
CHLORIDE: 100 mmol/L (ref 96–106)
CO2: 24 mmol/L (ref 20–29)
Calcium: 9.5 mg/dL (ref 8.7–10.2)
Creatinine, Ser: 0.76 mg/dL (ref 0.76–1.27)
GFR calc Af Amer: 116 mL/min/{1.73_m2} (ref >59)
GFR calc non Af Amer: 101 mL/min/{1.73_m2} (ref >59)
GLOBULIN, TOTAL: 2.3 g/dL (ref 1.5–4.5)
Glucose: 91 mg/dL (ref 65–99)
POTASSIUM: 4.2 mmol/L (ref 3.5–5.2)
SODIUM: 142 mmol/L (ref 134–144)
Total Protein: 7.2 g/dL (ref 6.0–8.5)

## 2017-09-23 LAB — HEMOGLOBIN A1C
ESTIMATED AVERAGE GLUCOSE: 108 mg/dL
HEMOGLOBIN A1C: 5.4 % (ref 4.8–5.6)

## 2017-09-23 LAB — PSA: PROSTATE SPECIFIC AG, SERUM: 0.8 ng/mL (ref 0.0–4.0)

## 2017-09-29 ENCOUNTER — Telehealth: Payer: Self-pay | Admitting: Emergency Medicine

## 2017-09-29 NOTE — Telephone Encounter (Signed)
PATIENT ADVISED

## 2017-09-29 NOTE — Telephone Encounter (Signed)
Pt is wanting his lab results from 09/22/2017.

## 2018-09-29 ENCOUNTER — Other Ambulatory Visit: Payer: Self-pay | Admitting: Emergency Medicine

## 2018-09-29 DIAGNOSIS — I10 Essential (primary) hypertension: Secondary | ICD-10-CM

## 2018-10-26 ENCOUNTER — Other Ambulatory Visit: Payer: Self-pay

## 2018-10-26 ENCOUNTER — Encounter: Payer: Self-pay | Admitting: Emergency Medicine

## 2018-10-26 ENCOUNTER — Ambulatory Visit: Payer: 59 | Admitting: Emergency Medicine

## 2018-10-26 VITALS — BP 120/78 | HR 78 | Temp 98.7°F | Ht 67.0 in | Wt 159.0 lb

## 2018-10-26 DIAGNOSIS — M7918 Myalgia, other site: Secondary | ICD-10-CM | POA: Diagnosis not present

## 2018-10-26 DIAGNOSIS — M62838 Other muscle spasm: Secondary | ICD-10-CM

## 2018-10-26 DIAGNOSIS — M5432 Sciatica, left side: Secondary | ICD-10-CM

## 2018-10-26 MED ORDER — METHYLPREDNISOLONE ACETATE 80 MG/ML IJ SUSP
80.0000 mg | Freq: Once | INTRAMUSCULAR | Status: AC
  Administered 2018-10-26: 80 mg via INTRAMUSCULAR

## 2018-10-26 MED ORDER — DICLOFENAC SODIUM 75 MG PO TBEC: 75.0000 mg | DELAYED_RELEASE_TABLET | Freq: Two times a day (BID) | ORAL | 0 refills | Status: AC

## 2018-10-26 MED ORDER — CYCLOBENZAPRINE HCL 10 MG PO TABS: 10.0000 mg | ORAL_TABLET | Freq: Every day | ORAL | 0 refills | Status: DC

## 2018-10-26 NOTE — Progress Notes (Signed)
John Wall 58 y.o.   Chief Complaint  Patient presents with  . left lower back pain    almost 2 months. Possible sciatica     HISTORY OF PRESENT ILLNESS: This is a 58 y.o. male complaining of sharp pain to left lumbar area with occasional radiation down the back of his left leg that started about 2 months ago.  Denies injury.  Pain waxes and wanes.  No associated symptoms.  Has history of similar event 5 years ago and diagnosed with sciatica.  Denies any other significant symptoms.  Back Pain This is a new problem. The current episode started more than 1 month ago. The problem occurs constantly. The problem has been waxing and waning since onset. The pain is present in the lumbar spine. The quality of the pain is described as aching. The pain radiates to the left thigh and left knee. The pain is at a severity of 7/10. The pain is moderate. The pain is worse during the day. The symptoms are aggravated by bending, position and twisting. Stiffness is present all day. Pertinent negatives include no abdominal pain, bladder incontinence, bowel incontinence, chest pain, dysuria, fever, headaches, numbness, paresis, paresthesias, pelvic pain, perianal numbness, tingling, weakness or weight loss. Risk factors: None. He has tried NSAIDs, analgesics, heat and ice for the symptoms. The treatment provided mild relief.     Prior to Admission medications   Medication Sig Start Date End Date Taking? Authorizing Provider  amLODipine (NORVASC) 5 MG tablet Take 1 tablet (5 mg total) by mouth daily. 09/22/17 10/26/18 Yes Beatric Fulop, Ines Bloomer, MD  Multiple Vitamin (MULTIVITAMIN) tablet Take 1 tablet by mouth daily.   Yes [provider]  OVER THE COUNTER MEDICATION as needed.   Yes [provider]    Allergies  Allergen Reactions  . Shrimp [Shellfish Allergy] Shortness Of Breath and Swelling  . Sulfa Antibiotics Rash    Patient Active Problem List   Diagnosis Date Noted  . Acute  non-recurrent maxillary sinusitis 10/13/2016  . Sinus congestion 10/13/2016  . Abnormal liver enzymes-2017 07/08/2015  . HTN (hypertension) 02/03/2013  . Chest pain 02/03/2013  . Murmur 02/03/2013    Past Medical History:  Diagnosis Date  . Allergy   . Arthritis    hands  . Hypertension   . Kidney stone 2012  . Plantar fasciitis    left foot    Past Surgical History:  Procedure Laterality Date  . APPENDECTOMY  1991  . KIDNEY STONE SURGERY  2012  . LIPOMA EXCISION  2004   abdominal wall LEFT  . RHINOPLASTY  1977  . TENDON REPAIR Left 2002   ankle  . TESTICLE SURGERY  1998    Social History   Socioeconomic History  . Marital status: Married    Spouse name: Not on file  . Number of children: Not on file  . Years of education: Not on file  . Highest education level: Not on file  Occupational History  . Occupation: drafting    Comment: Therapist, occupational  Social Needs  . Financial resource strain: Not on file  . Food insecurity    Worry: Not on file    Inability: Not on file  . Transportation needs    Medical: Not on file    Non-medical: Not on file  Tobacco Use  . Smoking status: Never Smoker  . Smokeless tobacco: Never Used  Substance and Sexual Activity  . Alcohol use: Yes    Alcohol/week: 5.0 standard drinks  Types: 2 Standard drinks or equivalent, 3 Cans of beer per week  . Drug use: Not on file  . Sexual activity: Not on file  Lifestyle  . Physical activity    Days per week: Not on file    Minutes per session: Not on file  . Stress: Not on file  Relationships  . Social Herbalist on phone: Not on file    Gets together: Not on file    Attends religious service: Not on file    Active member of club or organization: Not on file    Attends meetings of clubs or organizations: Not on file    Relationship status: Not on file  . Intimate partner violence    Fear of current or ex partner: Not on file    Emotionally abused: Not on file     Physically abused: Not on file    Forced sexual activity: Not on file  Other Topics Concern  . Not on file  Social History Narrative  . Not on file    Family History  Problem Relation Age of Onset  . Heart attack Father   . Hypertension Mother   . Colon cancer Neg Hx      Review of Systems  Constitutional: Negative for fever and weight loss.  HENT: Negative.   Eyes: Negative.   Respiratory: Negative.  Negative for cough and shortness of breath.   Cardiovascular: Negative for chest pain and palpitations.  Gastrointestinal: Negative.  Negative for abdominal pain and bowel incontinence.  Genitourinary: Negative for bladder incontinence, dysuria, hematuria and pelvic pain.  Musculoskeletal: Positive for back pain.  Skin: Negative.  Negative for rash.  Neurological: Negative for tingling, sensory change, focal weakness, weakness, numbness, headaches and paresthesias.  Endo/Heme/Allergies: Negative.   All other systems reviewed and are negative.   Vitals:   10/26/18 0946  BP: (!) 151/99  Pulse: 78  Temp: 98.7 F (37.1 C)  SpO2: 98%    Physical Exam Vitals signs reviewed.  Constitutional:      Appearance: Normal appearance.  HENT:     Head: Normocephalic and atraumatic.  Eyes:     Extraocular Movements: Extraocular movements intact.     Conjunctiva/sclera: Conjunctivae normal.     Pupils: Pupils are equal, round, and reactive to light.  Neck:     Musculoskeletal: Normal range of motion and neck supple.  Cardiovascular:     Rate and Rhythm: Normal rate and regular rhythm.     Heart sounds: Normal heart sounds.  Pulmonary:     Effort: Pulmonary effort is normal.     Breath sounds: Normal breath sounds.  Abdominal:     General: There is no distension.     Palpations: Abdomen is soft. There is no mass.     Tenderness: There is no abdominal tenderness.  Musculoskeletal:     Lumbar back: He exhibits decreased range of motion, tenderness and spasm. He exhibits no bony  tenderness and no edema.       Back:  Skin:    General: Skin is warm and dry.     Capillary Refill: Capillary refill takes less than 2 seconds.  Neurological:     General: No focal deficit present.     Mental Status: He is alert and oriented to person, place, and time.     Sensory: No sensory deficit.     Motor: No weakness.     Coordination: Coordination normal.     Gait: Gait normal.  Deep Tendon Reflexes: Reflexes normal.  Psychiatric:        Mood and Affect: Mood normal.        Behavior: Behavior normal.      ASSESSMENT & PLAN: [place] was seen today for left lower back pain.  Diagnoses and all orders for this visit:  Sciatica of left side -     methylPREDNISolone acetate (DEPO-MEDROL) injection 80 mg  Musculoskeletal pain -     diclofenac (VOLTAREN) 75 MG EC tablet; Take 1 tablet (75 mg total) by mouth 2 (two) times daily for 5 days. After 5 days take as needed.  Muscle spasm -     cyclobenzaprine (FLEXERIL) 10 MG tablet; Take 1 tablet (10 mg total) by mouth at bedtime.    Patient Instructions       If you have lab work done today you will be contacted with your lab results within the next 2 weeks.  If you have not heard from [place] then please contact us. The fastest way to get your results is to register for My Chart.   IF you received an x-ray today, you will receive an invoice from [facility]. Please contact [facility] at [phone] with questions or concerns regarding your invoice.   IF you received labwork today, you will receive an invoice from [facility]. Please contact [facility] at [phone] with questions or concerns regarding your invoice.   Our billing staff will not be able to assist you with questions regarding bills from these companies.  You will be contacted with the lab results as soon as they are available. The fastest way to get your results is to activate your My Chart account. Instructions are located on the last page  of this paperwork. If you have not heard from [place] regarding the results in 2 weeks, please contact this office.      Citica (Sciatica) La citica es el dolor, la debilidad, el entumecimiento o el hormigueo a lo largo del nervio citico. El nervio citico comienza en la parte inferior de la espalda y desciende por la parte posterior de cada pierna. La citica se produce cuando este nervio se comprime o se ejerce presin sobre l. Suele desaparecer por s sola o con tratamiento. A veces, la citica puede volver a aparecer. CUIDADOS EN EL HOGAR Medicament[employer] de venta libre y los recetados solamente como se lo haya indicado el mdico.  No conduzca ni use maquinaria pesada mientras toma analgsicos recetados. Control del dolor  Si se lo indican, aplique hielo sobre la zona afectada. ? Ponga el hielo en una bolsa plstica. ? Coloque una [employer] piel y la bolsa de hielo. ? Coloque el hielo durante 65minutos, 2 a 3veces por da.  Despus del hielo, aplique calor sobre la zona afectada antes de hacer ejercicio o con la frecuencia que le haya indicado el mdico. Use la fuente de calor que el mdico le indique, por ejemplo, una compresa de calor hmedo o una almohadilla trmica. ? Coloque una [employer] piel y la fuente de Freight forwarder. ? Aplique el calor durante 20 a 35minutos. ? Retire la fuente de calor si la piel se le pone de color rojo brillante. Esto es muy importante si no puede sentir el dolor, el calor o el fro. Puede correr un riesgo mayor de sufrir quemaduras. Actividad  Retome sus actividades habituales como se lo haya indicado el mdico. Pregntele al mdico qu actividades son seguras para usted. ? Evite las actividades  que empeoran la citica.  Descanse por breves perodos Agricultural consultant. Hgalo recostado o de pie. Esto suele ser mejor que descansar sentado. ? Cuando descanse durante perodos ms largos, haga alguna actividad fsica o un  estiramiento entre los perodos de descanso. ? Evite estar sentado durante largos perodos sin moverse. [place] y [place] al menos una vez cada hora.  Haga ejercicios y estrese con regularidad, como se lo indic el mdico.  No levante nada que pese ms de 10libras (4,5kg) mientras tenga sntomas de citica. ? Aunque no tenga sntomas, evite levantar objetos pesados. ? Evite levantar objetos pesados de forma repetida.  Al levantar objetos, hgalos siempre de una forma que sea segura para su cuerpo. Para esto, debe hacer lo siguiente: ? [place]. ? Mantenga el objeto cerca del cuerpo. ? No se tuerza. Instrucciones generales  Mantenga una buena postura. ? Evite reclinarse hacia adelante cuando est sentado. ? Evite encorvar la espalda mientras est de pie.  Mantenga un peso saludable.  Use calzado cmodo, que le d soporte al pie. Evite usar tacones.  Evite dormir sobre un colchn que sea demasiado blando o demasiado duro. Es posible que sienta menos dolor si duerme en un colchn con apoyo suficientemente firme para la espalda.  Concurra a todas las visitas de control como se lo haya indicado el mdico. Esto es importante. SOLICITE AYUDA SI:  Tiene un dolor con estas caractersticas: ? Lo despierta cuando est dormido. ? Empeora al estar recostado. ? Es Event organiser que tena en el pasado. ? Dura ms de 4semanas.  Pierde peso sin proponrselo. SOLICITE AYUDA DE INMEDIATO SI:  No puede controlar la orina (miccin) ni la evacuacin de la materia fecal (defecacin).  Tiene debilidad en alguna de estas zonas, y la debilidad empeora. ? La parte inferior de la espalda. ? La parte inferior del abdomen (pelvis). ? Los glteos. ? Las piernas.  Siente irritacin o inflamacin en la espalda.  Tiene sensacin de ardor al [employer]. Esta informacin no tiene Marine scientist el consejo del mdico. Asegrese de hacerle al mdico cualquier pregunta que tenga.  Document Released: 05/24/2010 Document Revised: 08/13/2015 Document Reviewed: 12/29/2014 Elsevier Interactive Patient Education  2019 Elsevier Inc.      Agustina Caroli, MD [facility]

## 2018-10-26 NOTE — Patient Instructions (Addendum)
If you have lab work done today you will be contacted with your lab results within the next 2 weeks.  If you have not heard from [place] then please contact us. The fastest way to get your results is to register for My Chart.   IF you received an x-ray today, you will receive an invoice from [facility]. Please contact [facility] at [phone] with questions or concerns regarding your invoice.   IF you received labwork today, you will receive an invoice from [place]. Please contact [facility] at [phone] with questions or concerns regarding your invoice.   Our billing staff will not be able to assist you with questions regarding bills from these companies.  You will be contacted with the lab results as soon as they are available. The fastest way to get your results is to activate your My Chart account. Instructions are located on the last page of this paperwork. If you have not heard from [place] regarding the results in 2 weeks, please contact this office.      Citica (Sciatica) La citica es el dolor, la debilidad, el entumecimiento o el hormigueo a lo largo del nervio citico. El nervio citico comienza en la parte inferior de la espalda y desciende por la parte posterior de cada pierna. La citica se produce cuando este nervio se comprime o se ejerce presin sobre l. Suele desaparecer por s sola o con tratamiento. A veces, la citica puede volver a aparecer. CUIDADOS EN EL HOGAR Medicamentos  [employer] de venta libre y los recetados solamente como se lo haya indicado el mdico.  No conduzca ni use maquinaria pesada mientras toma analgsicos recetados. Control del dolor  Si se lo indican, aplique hielo sobre la zona afectada. ? Ponga el hielo en una bolsa plstica. ? Coloque una [employer] piel y la bolsa de hielo. ? Coloque el hielo durante 4minutos, 2 a 3veces por da.  Despus del hielo, aplique calor sobre la zona afectada antes de  hacer ejercicio o con la frecuencia que le haya indicado el mdico. Use la fuente de calor que el mdico le indique, por ejemplo, una compresa de calor hmedo o una almohadilla trmica. ? Coloque una [employer] piel y la fuente de Freight forwarder. ? Aplique el calor durante 20 a 47minutos. ? Retire la fuente de calor si la piel se le pone de color rojo brillante. Esto es muy importante si no puede sentir el dolor, el calor o el fro. Puede correr un riesgo mayor de sufrir quemaduras. Actividad  Retome sus actividades habituales como se lo haya indicado el mdico. Pregntele al mdico qu actividades son seguras para usted. ? Evite las actividades que empeoran la citica.  Descanse por breves perodos Agricultural consultant. Hgalo recostado o de pie. Esto suele ser mejor que descansar sentado. ? Cuando descanse durante perodos ms largos, haga alguna actividad fsica o un estiramiento entre los perodos de descanso. ? Evite estar sentado durante largos perodos sin moverse. [place] y [place] al menos una vez cada hora.  Haga ejercicios y estrese con regularidad, como se lo indic el mdico.  No levante nada que pese ms de 10libras (4,5kg) mientras tenga sntomas de citica. ? Aunque no tenga sntomas, evite levantar objetos pesados. ? Evite levantar objetos pesados de forma repetida.  Al levantar objetos, hgalos siempre de una forma que sea segura para su cuerpo. Para esto, debe hacer lo siguiente: ? [place]. ? Mantenga el objeto cerca  del cuerpo. ? No se tuerza. Instrucciones generales  Mantenga una buena postura. ? Evite reclinarse hacia adelante cuando est sentado. ? Evite encorvar la espalda mientras est de pie.  Mantenga un peso saludable.  Use calzado cmodo, que le d soporte al pie. Evite usar tacones.  Evite dormir sobre un colchn que sea demasiado blando o demasiado duro. Es posible que sienta menos dolor si duerme en un colchn con apoyo suficientemente firme  para la espalda.  Concurra a todas las visitas de control como se lo haya indicado el mdico. Esto es importante. SOLICITE AYUDA SI:  Tiene un dolor con estas caractersticas: ? Lo despierta cuando est dormido. ? Empeora al estar recostado. ? Es Event organiser que tena en el pasado. ? Dura ms de 4semanas.  Pierde peso sin proponrselo. SOLICITE AYUDA DE INMEDIATO SI:  No puede controlar la orina (miccin) ni la evacuacin de la materia fecal (defecacin).  Tiene debilidad en alguna de estas zonas, y la debilidad empeora. ? La parte inferior de la espalda. ? La parte inferior del abdomen (pelvis). ? Los glteos. ? Las piernas.  Siente irritacin o inflamacin en la espalda.  Tiene sensacin de ardor al [employer]. Esta informacin no tiene Marine scientist el consejo del mdico. Asegrese de hacerle al mdico cualquier pregunta que tenga. Document Released: 05/24/2010 Document Revised: 08/13/2015 Document Reviewed: 12/29/2014 Elsevier Interactive Patient Education  2019 [employer].

## 2018-12-08 ENCOUNTER — Other Ambulatory Visit: Payer: Self-pay

## 2018-12-08 ENCOUNTER — Ambulatory Visit (INDEPENDENT_AMBULATORY_CARE_PROVIDER_SITE_OTHER): Payer: 59 | Admitting: Emergency Medicine

## 2018-12-08 ENCOUNTER — Encounter: Payer: Self-pay | Admitting: Emergency Medicine

## 2018-12-08 VITALS — BP 136/77 | HR 67 | Temp 98.8°F | Resp 16 | Wt 159.8 lb

## 2018-12-08 DIAGNOSIS — Z13228 Encounter for screening for other metabolic disorders: Secondary | ICD-10-CM

## 2018-12-08 DIAGNOSIS — Z130 Encounter for screening for diseases of the blood and blood-forming organs and certain disorders involving the immune mechanism: Secondary | ICD-10-CM | POA: Diagnosis not present

## 2018-12-08 DIAGNOSIS — Z1329 Encounter for screening for other suspected endocrine disorder: Secondary | ICD-10-CM

## 2018-12-08 DIAGNOSIS — Z125 Encounter for screening for malignant neoplasm of prostate: Secondary | ICD-10-CM

## 2018-12-08 DIAGNOSIS — I10 Essential (primary) hypertension: Secondary | ICD-10-CM

## 2018-12-08 DIAGNOSIS — Z13220 Encounter for screening for lipoid disorders: Secondary | ICD-10-CM

## 2018-12-08 DIAGNOSIS — Z0000 Encounter for general adult medical examination without abnormal findings: Secondary | ICD-10-CM | POA: Diagnosis not present

## 2018-12-08 MED ORDER — AMLODIPINE BESYLATE 5 MG PO TABS: 5.0000 mg | ORAL_TABLET | Freq: Every day | ORAL | 3 refills | Status: DC

## 2018-12-08 NOTE — Patient Instructions (Addendum)
   If you have lab work done today you will be contacted with your lab results within the next 2 weeks.  If you have not heard from us then please contact us. The fastest way to get your results is to register for My Chart.   IF you received an x-ray today, you will receive an invoice from [facility]. Please contact [facility] at [phone] with questions or concerns regarding your invoice.   IF you received labwork today, you will receive an invoice from [facility]. Please contact [facility] at [phone] with questions or concerns regarding your invoice.   Our billing staff will not be able to assist you with questions regarding bills from these companies.  You will be contacted with the lab results as soon as they are available. The fastest way to get your results is to activate your My Chart account. Instructions are located on the last page of this paperwork. If you have not heard from us regarding the results in 2 weeks, please contact this office.      Health Maintenance, Male Adopting a healthy lifestyle and getting preventive care are important in promoting health and wellness. Ask your health care provider about:  The right schedule for you to have regular tests and exams.  Things you can do on your own to prevent diseases and keep yourself healthy. What should I know about diet, weight, and exercise? Eat a healthy diet   Eat a diet that includes plenty of vegetables, fruits, low-fat dairy products, and lean protein.  Do not eat a lot of foods that are high in solid fats, added sugars, or sodium. Maintain a healthy weight Body mass index (BMI) is a measurement that can be used to identify possible weight problems. It estimates body fat based on height and weight. Your health care provider can help determine your BMI and help you achieve or maintain a healthy weight. Get regular exercise Get regular exercise. This is one of the most important things you  can do for your health. Most adults should:  Exercise for at least 150 minutes each week. The exercise should increase your heart rate and make you sweat (moderate-intensity exercise).  Do strengthening exercises at least twice a week. This is in addition to the moderate-intensity exercise.  Spend less time sitting. Even light physical activity can be beneficial. Watch cholesterol and blood lipids Have your blood tested for lipids and cholesterol at 60 years of age, then have this test every 5 years. You may need to have your cholesterol levels checked more often if:  Your lipid or cholesterol levels are high.  You are older than 60 years of age.  You are at high risk for heart disease. What should I know about cancer screening? Many types of cancers can be detected early and may often be prevented. Depending on your health history and family history, you may need to have cancer screening at various ages. This may include screening for:  Colorectal cancer.  Prostate cancer.  Skin cancer.  Lung cancer. What should I know about heart disease, diabetes, and high blood pressure? Blood pressure and heart disease  High blood pressure causes heart disease and increases the risk of stroke. This is more likely to develop in people who have high blood pressure readings, are of African descent, or are overweight.  Talk with your health care provider about your target blood pressure readings.  Have your blood pressure checked: ? Every 3-5 years if you are 18-39   years of age. ? Every year if you are 19 years old or older.  If you are between the ages of 61 and 53 and are a current or former smoker, ask your health care provider if you should have a one-time screening for abdominal aortic aneurysm (AAA). Diabetes Have regular diabetes screenings. This checks your fasting blood sugar level. Have the screening done:  Once every three years after age 21 if you are at a normal weight and have  a low risk for diabetes.  More often and at a younger age if you are overweight or have a high risk for diabetes. What should I know about preventing infection? Hepatitis B If you have a higher risk for hepatitis B, you should be screened for this virus. Talk with your health care provider to find out if you are at risk for hepatitis B infection. Hepatitis C Blood testing is recommended for:  Everyone born from [birth year] through 1967.  Anyone with known risk factors for hepatitis C. Sexually transmitted infections (STIs)  You should be screened each year for STIs, including gonorrhea and chlamydia, if: ? You are sexually active and are younger than 61 years of age. ? You are older than 60 years of age and your health care provider tells you that you are at risk for this type of infection. ? Your sexual activity has changed since you were last screened, and you are at increased risk for chlamydia or gonorrhea. Ask your health care provider if you are at risk.  Ask your health care provider about whether you are at high risk for HIV. Your health care provider may recommend a prescription medicine to help prevent HIV infection. If you choose to take medicine to prevent HIV, you should first get tested for HIV. You should then be tested every 3 months for as long as you are taking the medicine. Follow these instructions at home: Lifestyle  Do not use any products that contain nicotine or tobacco, such as cigarettes, e-cigarettes, and chewing tobacco. If you need help quitting, ask your health care provider.  Do not use street drugs.  Do not share needles.  Ask your health care provider for help if you need support or information about quitting drugs. Alcohol use  Do not drink alcohol if your health care provider tells you not to drink.  If you drink alcohol: ? Limit how much you have to 0-2 drinks a day. ? Be aware of how much alcohol is in your drink. In the [place] one drink equals one 12  oz bottle of beer (355 mL), one 5 oz glass of wine (148 mL), or one 1 oz glass of hard liquor (44 mL). General instructions  Schedule regular health, dental, and eye exams.  Stay current with your vaccines.  Tell your health care provider if: ? You often feel depressed. ? You have ever been abused or do not feel safe at home. Summary  Adopting a healthy lifestyle and getting preventive care are important in promoting health and wellness.  Follow your health care provider's instructions about healthy diet, exercising, and getting tested or screened for diseases.  Follow your health care provider's instructions on monitoring your cholesterol and blood pressure. This information is not intended to replace advice given to you by your health care provider. Make sure you discuss any questions you have with your health care provider. Document Released: 10/18/2007 Document Revised: 04/14/2018 Document Reviewed: 04/14/2018 Elsevier Patient Education  2020 [place]  la [employer], Male Adoptar un estilo de vida saludable y recibir atencin preventiva son importantes para promover la salud y Musician. Consulte al mdico sobre:  El esquema adecuado para hacerse pruebas y exmenes peridicos.  Cosas que puede hacer por su cuenta para prevenir enfermedades y [place] sano. Qu debo saber sobre la dieta, el peso y el ejercicio? Consuma una dieta saludable   Consuma una dieta que incluya muchas verduras, frutas, productos lcteos con bajo contenido de [place] y Advertising account planner.  No consuma muchos alimentos ricos en grasas slidas, azcares agregados o sodio. Mantenga un peso saludable El ndice de masa muscular [facility]) es una medida que puede utilizarse para identificar posibles problemas de [place]. Proporciona una estimacin de la grasa corporal basndose en el peso y la altura. Su mdico puede ayudarle a Radiation protection practitioner [place] y a Scientist, forensic o  Theatre manager un peso saludable. Haga ejercicio con regularidad Haga ejercicio con regularidad. Esta es una de las prcticas ms importantes que puede hacer por su salud. La mayora de los adultos deben seguir estas pautas:  Optometrist, al menos, 186minutos de actividad fsica por semana. El ejercicio debe aumentar la frecuencia cardaca y Nature conservation officer transpirar (ejercicio de intensidad moderada).  Hacer ejercicios de fortalecimiento por lo [employer] por semana. Agregue esto a su plan de ejercicio de intensidad moderada.  Pasar menos tiempo sentados. Incluso la actividad fsica ligera puede ser beneficiosa. Controle sus niveles de colesterol y lpidos en la sangre Comience a realizarse anlisis de lpidos y Research officer, trade union en la sangre a los 20aos y luego reptalos cada 5aos. Es posible que Automotive engineer los niveles de colesterol con mayor frecuencia si:  Sus niveles de lpidos y colesterol son altos.  Es mayor de 40aos.  Presenta un alto riesgo de padecer enfermedades cardacas. Qu debo saber sobre las pruebas de deteccin del cncer? Muchos tipos de cncer pueden detectarse de manera temprana y, a menudo, pueden prevenirse. Segn su historia clnica y sus antecedentes familiares, es posible que deba realizarse pruebas de deteccin del cncer en diferentes edades. Esto puede incluir pruebas de deteccin de lo siguiente:  Surveyor, minerals.  Cncer de prstata.  Cncer de piel.  Cncer de pulmn. Qu debo saber sobre la enfermedad cardaca, la diabetes y la hipertensin arterial? Presin arterial y enfermedad cardaca  La hipertensin arterial causa enfermedades cardacas y [place] el riesgo de accidente cerebrovascular. Es ms probable que esto se manifieste en las personas que tienen lecturas de presin arterial alta, tienen ascendencia africana o tienen sobrepeso.  Hable con el mdico sobre sus valores de presin arterial deseados.  Hgase controlar la presin  arterial: ? Cada 3 a 5 aos si tiene entre 18 y 64 aos. ? Todos los aos si es mayor de [place].  Si tiene entre 61 y 84 aos y es fumador o Insurance account manager, pregntele al mdico si debe realizarse una prueba de deteccin de aneurisma artico abdominal (AAA) por nica vez. Diabetes Realcese exmenes de deteccin de la diabetes con regularidad. Este anlisis revisa el nivel de azcar en la sangre en [place]. Hgase las pruebas de deteccin:  Cada tresaos despus de los 44aos de edad si tiene un peso normal y un bajo riesgo de padecer diabetes.  Con ms frecuencia y a partir de [place] edad inferior si tiene sobrepeso o un alto riesgo de padecer diabetes. Qu debo saber sobre la prevencin de infecciones? Hepatitis B Si tiene un riesgo ms alto de contraer hepatitis B, debe someterse a  un examen de deteccin de este virus. Hable con el mdico para averiguar si tiene riesgo de contraer la infeccin por hepatitis B. Hepatitis C Se recomienda un anlisis de [place] para:  Todos los que nacieron entre 1947 y [phone].  Todas las personas que tengan un riesgo de haber contrado hepatitis C. Enfermedades de transmisin sexual (ETS)  Debe realizarse pruebas de deteccin de ITS todos los aos, incluidas la gonorrea y la clamidia, si: ? Es sexualmente activo y es menor de 24aos. ? Es mayor de 24aos, y Investment banker, operational informa que corre riesgo de tener este tipo de infecciones. ? La actividad sexual ha cambiado desde que le hicieron la ltima prueba de deteccin y tiene un riesgo mayor de Best boy clamidia o Radio broadcast assistant. Pregntele al mdico si usted tiene riesgo.  Pregntele al mdico si usted tiene un alto riesgo de Museum/gallery curator VIH. El mdico tambin puede recomendarle un medicamento recetado para ayudar a evitar la infeccin por el VIH. Si elige tomar medicamentos para prevenir el VIH, primero debe [employer] de deteccin del VIH. Luego debe hacerse anlisis cada 83meses mientras est tomando los  medicamentos. Siga estas instrucciones en su casa: Estilo de vida  No consuma ningn producto que contenga nicotina o tabaco, como cigarrillos, cigarrillos electrnicos y tabaco de Higher education careers adviser. Si necesita ayuda para dejar de fumar, consulte al mdico.  No consuma drogas.  No comparta agujas.  Solicite ayuda a su mdico si necesita apoyo o informacin para abandonar las drogas. Consumo de alcohol  No beba alcohol si el mdico se lo prohbe.  Si bebe alcohol: ? Limite la cantidad que consume de 0 a 2 medidas por da. ? Est atento a la cantidad de alcohol que hay en las bebidas que toma. En los [place], una medida equivale a una botella de cerveza de 12oz (376ml), un vaso de vino de 5oz (174ml) o un vaso de una bebida alcohlica de alta graduacin de 1oz (71ml). Instrucciones generales  Realcese los estudios de rutina de la salud, dentales y de Public librarian.  [place].  Infrmele a su mdico si: ? Se siente deprimido con frecuencia. ? Alguna vez ha sido vctima de [place] o no se siente seguro en su casa. Resumen  Adoptar un estilo de vida saludable y recibir atencin preventiva son importantes para promover la salud y Musician.  Siga las instrucciones del mdico acerca de una dieta saludable, el ejercicio y la realizacin de pruebas o exmenes para Engineer, building services.  Siga las instrucciones del mdico con respecto al control del colesterol y la presin arterial. Esta informacin no tiene Marine scientist el consejo del mdico. Asegrese de hacerle al mdico cualquier pregunta que tenga. Document Released: 10/18/2007 Document Revised: 05/12/2018 Document Reviewed: 05/12/2018 Elsevier Patient Education  2020 [employer].

## 2018-12-08 NOTE — Progress Notes (Signed)
John Wall 58 y.o.   Chief Complaint  Patient presents with   Annual Exam   Medication Refill    Amlodipine    HISTORY OF PRESENT ILLNESS: This is a 58 y.o. male here for his annual exam.  Patient has no complaints or medical concerns today. Past medical history includes hypertension, on amlodipine 5 mg daily.  Doing well.  No complaints. Health maintenance reviewed: Up-to-date. Recently seen by me with left-sided sciatica.  Much better.  Asymptomatic. Physically active.  Non-smoker.  Occasional beer drinker. Much improved nutrition. Overall doing very well.  HPI   Prior to Admission medications   Medication Sig Start Date End Date Taking? Authorizing Provider  Multiple Vitamin (MULTIVITAMIN) tablet Take 1 tablet by mouth daily.   Yes [provider]  amLODipine (NORVASC) 5 MG tablet Take 1 tablet (5 mg total) by mouth daily. 09/22/17 10/26/18  Horald Pollen, MD  cyclobenzaprine (FLEXERIL) 10 MG tablet Take 1 tablet (10 mg total) by mouth at bedtime. Patient not taking: Reported on 12/08/2018 10/26/18   Horald Pollen, MD  [place] as needed.    [provider]    Allergies  Allergen Reactions   Shrimp [Shellfish Allergy] Shortness Of Breath and Swelling   Sulfa Antibiotics Rash    Patient Active Problem List   Diagnosis Date Noted   Abnormal liver enzymes-2017 07/08/2015   HTN (hypertension) 02/03/2013   Murmur 02/03/2013    Past Medical History:  Diagnosis Date   Allergy    Arthritis    hands   Hypertension    Kidney stone 2012   Plantar fasciitis    left foot    Past Surgical History:  Procedure Laterality Date   APPENDECTOMY  1991   KIDNEY STONE SURGERY  2012   LIPOMA EXCISION  2004   abdominal wall LEFT   RHINOPLASTY  1977   TENDON REPAIR Left 2002   ankle   TESTICLE SURGERY  1998    Social History   Socioeconomic History   Marital status: Married    Spouse name: Not on  file   Number of children: Not on file   Years of education: Not on file   Highest education level: Not on file  Occupational History   Occupation: drafting    Comment: Tax adviser strain: Not on file   Food insecurity    Worry: Not on file    Inability: Not on file   Transportation needs    Medical: Not on file    Non-medical: Not on file  Tobacco Use   Smoking status: Never Smoker   Smokeless tobacco: Never Used  Substance and Sexual Activity   Alcohol use: Yes    Alcohol/week: 5.0 standard drinks    Types: 2 Standard drinks or equivalent, 3 Cans of beer per week   Drug use: Not on file   Sexual activity: Not on file  Lifestyle   Physical activity    Days per week: Not on file    Minutes per session: Not on file   Stress: Not on file  Relationships   Social connections    Talks on phone: Not on file    Gets together: Not on file    Attends religious service: Not on file    Active member of club or organization: Not on file    Attends meetings of clubs or organizations: Not on file    Relationship status: Not on file  Intimate partner violence    Fear of current or ex partner: Not on file    Emotionally abused: Not on file    Physically abused: Not on file    Forced sexual activity: Not on file  Other Topics Concern   Not on file  Social History Narrative   Not on file    Family History  Problem Relation Age of Onset   Heart attack Father    Hypertension Mother    Colon cancer Neg Hx      Review of Systems  Constitutional: Negative.  Negative for chills, fever, malaise/fatigue and weight loss.  HENT: Negative.  Negative for congestion, hearing loss, nosebleeds and sore throat.   Eyes: Negative.  Negative for blurred vision and double vision.  Respiratory: Negative.  Negative for cough, hemoptysis, sputum production, shortness of breath and wheezing.   Cardiovascular: Negative.  Negative for  chest pain, palpitations and leg swelling.  Gastrointestinal: Negative.  Negative for abdominal pain, blood in stool, diarrhea, melena, nausea and vomiting.  Genitourinary: Negative.  Negative for dysuria, flank pain and hematuria.  Musculoskeletal: Negative.  Negative for back pain, joint pain, myalgias and neck pain.  Skin: Negative.  Negative for rash.  Neurological: Negative.  Negative for dizziness and headaches.  Endo/Heme/Allergies: Negative.   All other systems reviewed and are negative.  Today's Vitals   12/08/18 0943  BP: 136/77  Pulse: 67  Resp: 16  Temp: 98.8 F (37.1 C)  TempSrc: Oral  SpO2: 98%  Weight: 159 lb 12.8 oz (72.5 kg)   Body mass index is 25.03 kg/m.   Physical Exam Vitals signs reviewed.  Constitutional:      Appearance: Normal appearance.  HENT:     Head: Normocephalic and atraumatic.  Eyes:     Extraocular Movements: Extraocular movements intact.     Conjunctiva/sclera: Conjunctivae normal.     Pupils: Pupils are equal, round, and reactive to light.  Neck:     Musculoskeletal: Normal range of motion and neck supple.  Cardiovascular:     Rate and Rhythm: Normal rate and regular rhythm.     Pulses: Normal pulses.     Heart sounds: Normal heart sounds.  Pulmonary:     Effort: Pulmonary effort is normal.     Breath sounds: Normal breath sounds.  Abdominal:     General: There is no distension.     Palpations: Abdomen is soft.     Tenderness: There is no abdominal tenderness. There is no guarding.  Musculoskeletal: Normal range of motion.        General: No swelling.     Right lower leg: No edema.     Left lower leg: No edema.  Skin:    General: Skin is warm and dry.     Capillary Refill: Capillary refill takes less than 2 seconds.  Neurological:     General: No focal deficit present.     Mental Status: He is alert and oriented to person, place, and time.     Sensory: No sensory deficit.     Motor: No weakness.  Psychiatric:        Mood  and Affect: Mood normal.        Behavior: Behavior normal.      ASSESSMENT & PLAN: [place] was seen today for annual exam and medication refill.  Diagnoses and all orders for this visit:  Routine general medical examination at a health care facility  Essential hypertension -     amLODipine (NORVASC) 5  MG tablet; Take 1 tablet (5 mg total) by mouth daily. -     Comprehensive metabolic panel  Screening for deficiency anemia -     CBC with Differential/Platelet  Screening for lipoid disorders -     Lipid panel  Screening for endocrine, metabolic and immunity disorder -     Hemoglobin A1c  Prostate cancer screening -     PSA    Patient Instructions       If you have lab work done today you will be contacted with your lab results within the next 2 weeks.  If you have not heard from [place] then please contact us. The fastest way to get your results is to register for My Chart.   IF you received an x-ray today, you will receive an invoice from [facility]. Please contact [facility] at [phone] with questions or concerns regarding your invoice.   IF you received labwork today, you will receive an invoice from [place]. Please contact [facility] at [phone] with questions or concerns regarding your invoice.   Our billing staff will not be able to assist you with questions regarding bills from these companies.  You will be contacted with the lab results as soon as they are available. The fastest way to get your results is to activate your My Chart account. Instructions are located on the last page of this paperwork. If you have not heard from [place] regarding the results in 2 weeks, please contact this office.      Health Maintenance, Male Adopting a healthy lifestyle and getting preventive care are important in promoting health and wellness. Ask your health care provider about:  The right schedule for you to have regular tests and exams.  Things you can  do on your own to prevent diseases and keep yourself healthy. What should I know about diet, weight, and exercise? Eat a healthy diet   Eat a diet that includes plenty of vegetables, fruits, low-fat dairy products, and lean protein.  Do not eat a lot of foods that are high in solid fats, added sugars, or sodium. Maintain a healthy weight Body mass index (BMI) is a measurement that can be used to identify possible weight problems. It estimates body fat based on height and weight. Your health care provider can help determine your BMI and help you achieve or maintain a healthy weight. Get regular exercise Get regular exercise. This is one of the most important things you can do for your health. Most adults should:  Exercise for at least 150 minutes each week. The exercise should increase your heart rate and make you sweat (moderate-intensity exercise).  Do strengthening exercises at least twice a week. This is in addition to the moderate-intensity exercise.  Spend less time sitting. Even light physical activity can be beneficial. Watch cholesterol and blood lipids Have your blood tested for lipids and cholesterol at 60 years of age, then have this test every 5 years. You may need to have your cholesterol levels checked more often if:  Your lipid or cholesterol levels are high.  You are older than 60 years of age.  You are at high risk for heart disease. What should I know about cancer screening? Many types of cancers can be detected early and may often be prevented. Depending on your health history and family history, you may need to have cancer screening at various ages. This may include screening for:  Colorectal cancer.  Prostate cancer.  Skin cancer.  Lung cancer. What  should I know about heart disease, diabetes, and high blood pressure? Blood pressure and heart disease  High blood pressure causes heart disease and increases the risk of stroke. This is more likely to develop  in people who have high blood pressure readings, are of African descent, or are overweight.  Talk with your health care provider about your target blood pressure readings.  Have your blood pressure checked: ? Every 3-5 years if you are 55-41 years of age. ? Every year if you are 6 years old or older.  If you are between the ages of 58 and 76 and are a current or former smoker, ask your health care provider if you should have a one-time screening for abdominal aortic aneurysm (AAA). Diabetes Have regular diabetes screenings. This checks your fasting blood sugar level. Have the screening done:  Once every three years after age 65 if you are at a normal weight and have a low risk for diabetes.  More often and at a younger age if you are overweight or have a high risk for diabetes. What should I know about preventing infection? Hepatitis B If you have a higher risk for hepatitis B, you should be screened for this virus. Talk with your health care provider to find out if you are at risk for hepatitis B infection. Hepatitis C Blood testing is recommended for:  Everyone born from [birth year] through 1967.  Anyone with known risk factors for hepatitis C. Sexually transmitted infections (STIs)  You should be screened each year for STIs, including gonorrhea and chlamydia, if: ? You are sexually active and are younger than 60 years of age. ? You are older than 59 years of age and your health care provider tells you that you are at risk for this type of infection. ? Your sexual activity has changed since you were last screened, and you are at increased risk for chlamydia or gonorrhea. Ask your health care provider if you are at risk.  Ask your health care provider about whether you are at high risk for HIV. Your health care provider may recommend a prescription medicine to help prevent HIV infection. If you choose to take medicine to prevent HIV, you should first get tested for HIV. You should then be  tested every 3 months for as long as you are taking the medicine. Follow these instructions at home: Lifestyle  Do not use any products that contain nicotine or tobacco, such as cigarettes, e-cigarettes, and chewing tobacco. If you need help quitting, ask your health care provider.  Do not use street drugs.  Do not share needles.  Ask your health care provider for help if you need support or information about quitting drugs. Alcohol use  Do not drink alcohol if your health care provider tells you not to drink.  If you drink alcohol: ? Limit how much you have to 0-2 drinks a day. ? Be aware of how much alcohol is in your drink. In the [place] one drink equals one 12 oz bottle of beer (355 mL), one 5 oz glass of wine (148 mL), or one 1 oz glass of hard liquor (44 mL). General instructions  Schedule regular health, dental, and eye exams.  Stay current with your vaccines.  Tell your health care provider if: ? You often feel depressed. ? You have ever been abused or do not feel safe at home. Summary  Adopting a healthy lifestyle and getting preventive care are important in promoting health and wellness.  Follow your  health care provider's instructions about healthy diet, exercising, and getting tested or screened for diseases.  Follow your health care provider's instructions on monitoring your cholesterol and blood pressure. This information is not intended to replace advice given to you by your health care provider. Make sure you discuss any questions you have with your health care provider. Document Released: 10/18/2007 Document Revised: 04/14/2018 Document Reviewed: 04/14/2018 Elsevier Patient Education  2020 Elsevier Inc.      Agustina Caroli, MD [facility]

## 2018-12-09 ENCOUNTER — Encounter: Payer: Self-pay | Admitting: Radiology

## 2018-12-09 ENCOUNTER — Telehealth: Payer: Self-pay | Admitting: Emergency Medicine

## 2018-12-09 LAB — CBC WITH DIFFERENTIAL/PLATELET
Basophils Absolute: 0 10*3/uL (ref 0.0–0.2)
Basos: 0 %
EOS (ABSOLUTE): 0.1 10*3/uL (ref 0.0–0.4)
Eos: 2 %
Hematocrit: 48.6 % (ref 37.5–51.0)
Hemoglobin: 16.2 g/dL (ref 13.0–17.7)
Immature Grans (Abs): 0 10*3/uL (ref 0.0–0.1)
Immature Granulocytes: 0 %
Lymphocytes Absolute: 1.5 10*3/uL (ref 0.7–3.1)
Lymphs: 20 %
MCH: 30.5 pg (ref 26.6–33.0)
MCHC: 33.3 g/dL (ref 31.5–35.7)
MCV: 92 fL (ref 79–97)
Monocytes Absolute: 0.5 10*3/uL (ref 0.1–0.9)
Monocytes: 6 %
Neutrophils Absolute: 5.4 10*3/uL (ref 1.4–7.0)
Neutrophils: 72 %
Platelets: 273 10*3/uL (ref 150–450)
RBC: 5.31 x10E6/uL (ref 4.14–5.80)
RDW: 13.3 % (ref 11.6–15.4)
WBC: 7.5 10*3/uL (ref 3.4–10.8)

## 2018-12-09 LAB — HEMOGLOBIN A1C
Est. average glucose Bld gHb Est-mCnc: 108 mg/dL
Hgb A1c MFr Bld: 5.4 % (ref 4.8–5.6)

## 2018-12-09 LAB — COMPREHENSIVE METABOLIC PANEL
ALT: 20 IU/L (ref 0–44)
AST: 26 IU/L (ref 0–40)
Albumin/Globulin Ratio: 2.1 (ref 1.2–2.2)
Albumin: 4.7 g/dL (ref 3.8–4.9)
Alkaline Phosphatase: 77 IU/L (ref 39–117)
BUN/Creatinine Ratio: 22 — ABNORMAL HIGH (ref 9–20)
BUN: 18 mg/dL (ref 6–24)
Bilirubin Total: 0.8 mg/dL (ref 0.0–1.2)
CO2: 22 mmol/L (ref 20–29)
Calcium: 9.6 mg/dL (ref 8.7–10.2)
Chloride: 99 mmol/L (ref 96–106)
Creatinine, Ser: 0.81 mg/dL (ref 0.76–1.27)
GFR calc Af Amer: 112 mL/min/{1.73_m2} (ref >59)
GFR calc non Af Amer: 97 mL/min/{1.73_m2} (ref >59)
Globulin, Total: 2.2 g/dL (ref 1.5–4.5)
Glucose: 88 mg/dL (ref 65–99)
Potassium: 3.8 mmol/L (ref 3.5–5.2)
Sodium: 138 mmol/L (ref 134–144)
Total Protein: 6.9 g/dL (ref 6.0–8.5)

## 2018-12-09 LAB — PSA: Prostate Specific Ag, Serum: 1.7 ng/mL (ref 0.0–4.0)

## 2018-12-09 LAB — LIPID PANEL
Chol/HDL Ratio: 3.8 ratio (ref 0.0–5.0)
Cholesterol, Total: 192 mg/dL (ref 100–199)
HDL: 50 mg/dL (ref >39)
LDL Calculated: 118 mg/dL — ABNORMAL HIGH (ref 0–99)
Triglycerides: 121 mg/dL (ref 0–149)
VLDL Cholesterol Cal: 24 mg/dL (ref 5–40)

## 2018-12-09 NOTE — Telephone Encounter (Signed)
No answer.  Left a message.  Normal blood results.

## 2019-04-20 ENCOUNTER — Ambulatory Visit: Payer: 59 | Admitting: Emergency Medicine

## 2019-04-20 ENCOUNTER — Other Ambulatory Visit: Payer: Self-pay

## 2019-04-20 ENCOUNTER — Encounter: Payer: Self-pay | Admitting: Emergency Medicine

## 2019-04-20 VITALS — BP 152/82 | HR 70 | Temp 98.4°F | Resp 16 | Ht 67.0 in | Wt 163.2 lb

## 2019-04-20 DIAGNOSIS — R319 Hematuria, unspecified: Secondary | ICD-10-CM | POA: Diagnosis not present

## 2019-04-20 DIAGNOSIS — N23 Unspecified renal colic: Secondary | ICD-10-CM | POA: Diagnosis not present

## 2019-04-20 DIAGNOSIS — Z87442 Personal history of urinary calculi: Secondary | ICD-10-CM | POA: Diagnosis not present

## 2019-04-20 DIAGNOSIS — R350 Frequency of micturition: Secondary | ICD-10-CM

## 2019-04-20 DIAGNOSIS — N200 Calculus of kidney: Secondary | ICD-10-CM

## 2019-04-20 LAB — POCT URINALYSIS DIP (MANUAL ENTRY)
Bilirubin, UA: NEGATIVE
Glucose, UA: NEGATIVE mg/dL
Ketones, POC UA: NEGATIVE mg/dL
Leukocytes, UA: NEGATIVE
Nitrite, UA: NEGATIVE
Protein Ur, POC: NEGATIVE mg/dL
Spec Grav, UA: 1.015 (ref 1.010–1.025)
Urobilinogen, UA: 0.2 E.U./dL
pH, UA: 6.5 (ref 5.0–8.0)

## 2019-04-20 MED ORDER — CIPROFLOXACIN HCL 500 MG PO TABS: 500.0000 mg | ORAL_TABLET | Freq: Two times a day (BID) | ORAL | 0 refills | Status: AC

## 2019-04-20 NOTE — Patient Instructions (Addendum)
If you have lab work done today you will be contacted with your lab results within the next 2 weeks.  If you have not heard from [place] then please contact us. The fastest way to get your results is to register for My Chart.   IF you received an x-ray today, you will receive an invoice from [facility]. Please contact [facility] at [phone] with questions or concerns regarding your invoice.   IF you received labwork today, you will receive an invoice from [place]. Please contact [facility] at [phone] with questions or concerns regarding your invoice.   Our billing staff will not be able to assist you with questions regarding bills from these companies.  You will be contacted with the lab results as soon as they are available. The fastest way to get your results is to activate your My Chart account. Instructions are located on the last page of this paperwork. If you have not heard from [place] regarding the results in 2 weeks, please contact this office.     Clculos renales Kidney Stones  Los clculos renales (urolitiasis) son masas slidas que se forman en el interior de los riones. Los riones son los rganos que producen el pis (la [place]). Un clculo renal puede provocar un dolor muy intenso y obstruir el flujo de pis. Por lo general, el clculo sale del cuerpo (se elimina) a travs del pis. Es posible que un mdico deba extraerle el clculo. Siga estas indicaciones en su casa: Comida y bebida  Beba suficiente lquido como para mantener el pis claro o de color amarillo plido. Esto lo ayudar a eliminar el clculo renal.  Si el mdico se lo indica, haga cambios en los alimentos que consume (su dieta). Estos pueden incluir lo siguiente: ? Limitar la cantidad de sal (sodio) que consume. ? Comer ms frutas y verduras. ? Limitar la ingesta de carne de res, carne de ave, pescado y huevos.  Siga las indicaciones del mdico respecto de las restricciones en las  comidas o las bebidas. Instrucciones generales  Recolecte las muestras de pis como se lo haya indicado el mdico. Deber recolectar una muestra de pis en los siguientes momentos: ? 24horas despus de eliminar un clculo. ? Entre 8y12semanas despus de haber eliminado el clculo, y cada 6a107meses despus de eso.  Cuele el pis cada vez que haga pis (orine) durante el tiempo que le indiquen. Use el colador que le recomiende el mdico.  No deseche el clculo. Consrvelo para que el mdico lo pueda Physiological scientist.  Tome los medicamentos de venta libre y los recetados solamente como se lo haya indicado el mdico.  Consulting civil engineer a todas las visitas de seguimiento como se lo haya indicado el mdico. Esto es importante. Es posible que deba realizarse exmenes de seguimiento. Prevencin de la formacin de clculos renales Para prevenir la formacin de otro clculo renal:  Beba suficiente lquido como para mantener el pis claro o de color amarillo plido. Esta es la mejor manera de prevenir la formacin de clculos renales.  Consuma alimentos saludables.  Evite determinados alimentos segn se lo haya indicado el mdico. Es posible que le indiquen que consuma menos protenas.  Mantenga un peso saludable. Comunquese con un mdico si:  Tiene un dolor que empeora o que no mejora con los medicamentos. Solicite ayuda de inmediato si:  Tiene fiebre o siente escalofros.  Siente un dolor muy intenso.  Tiene un dolor nuevo en el vientre (abdomen).  Pierde el conocimiento (se desmaya).  No puede hacer pis. Esta informacin no tiene Marine scientist el consejo del mdico. Asegrese de hacerle al mdico cualquier pregunta que tenga. Document Released: 07/18/2008 Document Revised: 01/15/2017 Document Reviewed: 10/05/2015 Elsevier Patient Education  2020 [place].  Hematuria en adultos Hematuria, Adult La hematuria es la presencia de Biochemist, clinical en la orina. La sangre puede ser visible en la orina  o se puede identificar con un anlisis. La causa de esta afeccin puede ser infecciones de la vejiga, la uretra, el rin o la prstata. Otras causas posibles incluyen lo siguiente:  Clculos en el rin.  Cncer de las vas urinarias.  Exceso de calcio en la orina.  Enfermedades que se transmiten de padres a hijos (enfermedades hereditarias).  Ejercicios que Dealer. Normalmente, las infecciones pueden tratarse con medicamentos, y los clculos renales, por lo general, se eliminarn a travs de la orina. Si ninguna de estas es la causa de la hematuria, quizs sea Chartered loss adjuster ms estudios para identificar la causa de los sntomas que presenta. Es muy importante que le informe a su mdico si ve sangre en la orina, incluso si no tiene dolor o si el sangrado desaparece sin tratamiento. La sangre en la orina, que desaparece y vuelve a Arts administrator, puede ser un sntoma de una enfermedad muy grave, incluido Science writer. No se manifiesta dolor en las etapas iniciales de muchos tipos de cncer urinarios. Siga estas indicaciones en su casa: Medicamentos  [employer] de venta libre y los recetados solamente como se lo haya indicado el mdico.  Si le recetaron un antibitico, tmelo como se lo haya indicado el mdico. No deje de tomar el antibitico aunque comience a sentirse mejor. Comida y bebida  Beba suficiente lquido para mantener la orina clara o de color amarillo plido. Se recomienda que beba de 3a4cuartos de galn (de 2,8a3,8l) por da. Si le han diagnosticado una infeccin, se recomienda beber jugo de [place], adems de grandes cantidades de agua.  Evite la cafena, el t y las bebidas gaseosas. Estas sustancias tienden a Medical illustrator.  Evite el alcohol, ya que puede irritar la prstata (hombres). Instrucciones generales  Si le han diagnosticado clculos renales, siga las instrucciones de su mdico con respecto a filtrar la orina para Research scientist (medical)  clculo.  Vace la vejiga con frecuencia. Evite retener la orina durante largos perodos.  Si es mujer: ? Despus de una deposicin, higiencese desde adelante hacia atrs y use cada trozo de papel higinico por nica vez. ? Vaciar la vejiga antes y despus de Clinical biochemist.  Est atento a cualquier cambio en los sntomas. Informe al mdico sobre cualquier cambio o sntoma nuevo.  Es su responsabilidad retirar el resultado del [place]. Consulte a su mdico o en el departamento donde se realice el estudio cundo estarn [employer].  Concurra a todas las visitas de seguimiento como se lo haya indicado el mdico. Esto es importante. Comunquese con un mdico si:  Siente dolor en la espalda.  Tiene fiebre.  Tiene nuseas o vmitos.  Los sntomas no mejoran despus de 3das.  Los sntomas empeoran. Solicite ayuda de inmediato si:  Presenta muchos vmitos y no puede tomar los medicamentos sin vomitar.  Siente dolor intenso en la espalda o el abdomen, a pesar de [employer].  Tiene una gran cantidad de sangre en la orina.  Despide cogulos de [employer].  Se siente muy dbil o como si fuera a desmayarse.  Se desmaya.  Resumen  La hematuria es la presencia de sangre en la orina. Puede tener muchas causas posibles.  Es muy importante que le informe a su mdico si ve sangre en la orina, incluso si no tiene dolor o si el sangrado desaparece sin tratamiento.  Tome los medicamentos de venta libre y los recetados solamente como se lo haya indicado el mdico.  Beba suficiente lquido para Theatre manager la orina clara o de color amarillo plido. Esta informacin no tiene Marine scientist el consejo del mdico. Asegrese de hacerle al mdico cualquier pregunta que tenga. Document Released: 04/21/2005 Document Revised: 12/15/2016 Document Reviewed: 12/15/2016 Elsevier Patient Education  2020 [employer].

## 2019-04-20 NOTE — Progress Notes (Addendum)
Milton Ferguson 58 y.o.   Chief Complaint  Patient presents with  . Hematuria    started yesterday saw blood in urine  . Urinary Frequency    patient urine 3-4 times at nigh    HISTORY OF PRESENT ILLNESS: This is a 58 y.o. male with history of kidney stone status post lithotripsy in 2010 noticed hematuria with urinary frequency the last couple nights.  Denies fever or chills.  Denies nausea or vomiting.  Had mild discomfort to the left flank area.  No other significant symptoms.  Feels better today.  HPI   Prior to Admission medications   Medication Sig Start Date End Date Taking? Authorizing Provider  Multiple Vitamin (MULTIVITAMIN) tablet Take 1 tablet by mouth daily.   Yes [provider]  OVER THE COUNTER MEDICATION as needed.   Yes [provider]  amLODipine (NORVASC) 5 MG tablet Take 1 tablet (5 mg total) by mouth daily. 12/08/18 03/08/19  Horald Pollen, MD  cyclobenzaprine (FLEXERIL) 10 MG tablet Take 1 tablet (10 mg total) by mouth at bedtime. Patient not taking: Reported on 12/08/2018 10/26/18   Horald Pollen, MD    Allergies  Allergen Reactions  . Shrimp [Shellfish Allergy] Shortness Of Breath and Swelling  . Sulfa Antibiotics Rash    Patient Active Problem List   Diagnosis Date Noted  . Abnormal liver enzymes-2017 07/08/2015  . HTN (hypertension) 02/03/2013  . Murmur 02/03/2013    Past Medical History:  Diagnosis Date  . Allergy   . Arthritis    hands  . Hypertension   . Kidney stone 2012  . Plantar fasciitis    left foot    Past Surgical History:  Procedure Laterality Date  . APPENDECTOMY  1991  . KIDNEY STONE SURGERY  2012  . LIPOMA EXCISION  2004   abdominal wall LEFT  . RHINOPLASTY  1977  . TENDON REPAIR Left 2002   ankle  . TESTICLE SURGERY  1998    Social History   Socioeconomic History  . Marital status: Married    Spouse name: Not on file  . Number of children: Not on file  . Years of education: Not on  file  . Highest education level: Not on file  Occupational History  . Occupation: drafting    Comment: Therapist, occupational  Tobacco Use  . Smoking status: Never Smoker  . Smokeless tobacco: Never Used  Substance and Sexual Activity  . Alcohol use: Yes    Alcohol/week: 5.0 standard drinks    Types: 2 Standard drinks or equivalent, 3 Cans of beer per week  . Drug use: Not on file  . Sexual activity: Not on file  Other Topics Concern  . Not on file  Social History Narrative  . Not on file   Social Determinants of Health   Financial Resource Strain:   . Difficulty of Paying Living Expenses: Not on file  Food Insecurity:   . Worried About Charity fundraiser in the Last Year: Not on file  . Ran Out of Food in the Last Year: Not on file  Transportation Needs:   . Lack of Transportation (Medical): Not on file  . Lack of Transportation (Non-Medical): Not on file  Physical Activity:   . Days of Exercise per Week: Not on file  . Minutes of Exercise per Session: Not on file  Stress:   . Feeling of Stress : Not on file  Social Connections:   . Frequency of Communication with Friends and  Family: Not on file  . Frequency of Social Gatherings with Friends and Family: Not on file  . Attends Religious Services: Not on file  . Active Member of Clubs or Organizations: Not on file  . Attends Archivist Meetings: Not on file  . Marital Status: Not on file  Intimate Partner Violence:   . Fear of Current or Ex-Partner: Not on file  . Emotionally Abused: Not on file  . Physically Abused: Not on file  . Sexually Abused: Not on file    Family History  Problem Relation Age of Onset  . Heart attack Father   . Hypertension Mother   . Colon cancer Neg Hx      Review of Systems  Constitutional: Negative.  Negative for chills and fever.  HENT: Negative.  Negative for congestion and sore throat.   Respiratory: Negative.  Negative for cough and shortness of breath.   Cardiovascular:  Negative.  Negative for chest pain and palpitations.  Gastrointestinal: Negative.  Negative for abdominal pain, blood in stool, diarrhea, melena, nausea and vomiting.  Genitourinary: Positive for hematuria.  Musculoskeletal: Negative.  Negative for myalgias.  Skin: Negative.  Negative for rash.  Neurological: Negative for dizziness and headaches.  All other systems reviewed and are negative.   Today's Vitals   04/20/19 1542  BP: (!) 152/82  Pulse: 70  Resp: 16  Temp: 98.4 F (36.9 C)  TempSrc: Oral  SpO2: 98%  Weight: 163 lb 3.2 oz (74 kg)  Height: 5\' 7"  (1.702 m)   Body mass index is 25.56 kg/m.  Physical Exam Vitals reviewed.  Constitutional:      Appearance: Normal appearance.  HENT:     Head: Normocephalic.  Eyes:     Extraocular Movements: Extraocular movements intact.     Pupils: Pupils are equal, round, and reactive to light.  Cardiovascular:     Rate and Rhythm: Normal rate and regular rhythm.     Pulses: Normal pulses.     Heart sounds: Normal heart sounds.  Pulmonary:     Effort: Pulmonary effort is normal.     Breath sounds: Normal breath sounds.  Abdominal:     General: Bowel sounds are normal. There is no distension.     Palpations: Abdomen is soft. There is no mass.     Tenderness: There is no abdominal tenderness. There is no right CVA tenderness or left CVA tenderness.  Musculoskeletal:        General: Normal range of motion.     Cervical back: Normal range of motion.  Skin:    General: Skin is warm and dry.     Capillary Refill: Capillary refill takes less than 2 seconds.  Neurological:     General: No focal deficit present.     Mental Status: He is alert and oriented to person, place, and time.  Psychiatric:        Mood and Affect: Mood normal.        Behavior: Behavior normal.    Results for orders placed or performed in visit on 04/20/19 (from the past 24 hour(s))  POCT urinalysis dipstick     Status: Abnormal   Collection Time: 04/20/19   3:56 PM  Result Value Ref Range   Color, UA yellow yellow   Clarity, UA clear clear   Glucose, UA negative negative mg/dL   Bilirubin, UA negative negative   Ketones, POC UA negative negative mg/dL   Spec Grav, UA 1.015 1.010 - 1.025   Blood,  UA large (A) negative   pH, UA 6.5 5.0 - 8.0   Protein Ur, POC negative negative mg/dL   Urobilinogen, UA 0.2 0.2 or 1.0 E.U./dL   Nitrite, UA Negative Negative   Leukocytes, UA Negative Negative     ASSESSMENT & PLAN: [place] was seen today for hematuria and urinary frequency.  Diagnoses and all orders for this visit:  Hematuria, unspecified type -     POCT urinalysis dipstick -     Urine culture -     CBC with Differential -     Comprehensive metabolic panel -     CT Abdomen Pelvis Wo Contrast; Future -     ciprofloxacin (CIPRO) 500 MG tablet; Take 1 tablet (500 mg total) by mouth 2 (two) times daily for 7 days.  Urinary frequency -     POCT urinalysis dipstick  Renal colic on left side -     CT Abdomen Pelvis Wo Contrast; Future  History of kidney stones -     CT Abdomen Pelvis Wo Contrast; Future  Nephrolithiasis     Patient Instructions       If you have lab work done today you will be contacted with your lab results within the next 2 weeks.  If you have not heard from [place] then please contact us. The fastest way to get your results is to register for My Chart.   IF you received an x-ray today, you will receive an invoice from [facility]. Please contact [facility] at [phone] with questions or concerns regarding your invoice.   IF you received labwork today, you will receive an invoice from [place] [facility]. Please contact [facility] at [phone] with questions or concerns regarding your invoice.   Our billing staff will not be able to assist you with questions regarding bills from these companies.  You will be contacted with the lab results as soon as they are available. The fastest way to get your  results is to activate your My Chart account. Instructions are located on the last page of this paperwork. If you have not heard from [place] regarding the results in 2 weeks, please contact this office.     Clculos renales Kidney Stones  Los clculos renales (urolitiasis) son masas slidas que se forman en el interior de los riones. Los riones son los rganos que producen el pis (la [place]). Un clculo renal puede provocar un dolor muy intenso y obstruir el flujo de pis. Por lo general, el clculo sale del cuerpo (se elimina) a travs del pis. Es posible que un mdico deba extraerle el clculo. Siga estas indicaciones en su casa: Comida y bebida  Beba suficiente lquido como para mantener el pis claro o de color amarillo plido. Esto lo ayudar a eliminar el clculo renal.  Si el mdico se lo indica, haga cambios en los alimentos que consume (su dieta). Estos pueden incluir lo siguiente: ? Limitar la cantidad de sal (sodio) que consume. ? Comer ms frutas y verduras. ? Limitar la ingesta de carne de res, carne de ave, pescado y huevos.  Siga las indicaciones del mdico respecto de las restricciones en las comidas o las bebidas. Instrucciones generales  Recolecte las muestras de pis como se lo haya indicado el mdico. Deber recolectar una muestra de pis en los siguientes momentos: ? 24horas despus de eliminar un clculo. ? Entre 8y12semanas despus de haber eliminado el clculo, y cada 6a59meses despus de eso.  Cuele el pis cada vez que haga pis (orine) durante  el tiempo que le indiquen. Use el colador que le recomiende el mdico.  No deseche el clculo. Consrvelo para que el mdico lo pueda Physiological scientist.  Tome los medicamentos de venta libre y los recetados solamente como se lo haya indicado el mdico.  Consulting civil engineer a todas las visitas de seguimiento como se lo haya indicado el mdico. Esto es importante. Es posible que deba realizarse exmenes de seguimiento. Prevencin de la  formacin de clculos renales Para prevenir la formacin de otro clculo renal:  Beba suficiente lquido como para mantener el pis claro o de color amarillo plido. Esta es la mejor manera de prevenir la formacin de clculos renales.  Consuma alimentos saludables.  Evite determinados alimentos segn se lo haya indicado el mdico. Es posible que le indiquen que consuma menos protenas.  Mantenga un peso saludable. Comunquese con un mdico si:  Tiene un dolor que empeora o que no mejora con los medicamentos. Solicite ayuda de inmediato si:  Tiene fiebre o siente escalofros.  Siente un dolor muy intenso.  Tiene un dolor nuevo en el vientre (abdomen).  Pierde el conocimiento (se desmaya).  No puede hacer pis. Esta informacin no tiene Marine scientist el consejo del mdico. Asegrese de hacerle al mdico cualquier pregunta que tenga. Document Released: 07/18/2008 Document Revised: 01/15/2017 Document Reviewed: 10/05/2015 Elsevier Patient Education  2020 [place].  Hematuria en adultos Hematuria, Adult La hematuria es la presencia de Biochemist, clinical en la orina. La sangre puede ser visible en la orina o se puede identificar con un anlisis. La causa de esta afeccin puede ser infecciones de la vejiga, la uretra, el rin o la prstata. Otras causas posibles incluyen lo siguiente:  Clculos en el rin.  Cncer de las vas urinarias.  Exceso de calcio en la orina.  Enfermedades que se transmiten de padres a hijos (enfermedades hereditarias).  Ejercicios que Dealer. Normalmente, las infecciones pueden tratarse con medicamentos, y los clculos renales, por lo general, se eliminarn a travs de la orina. Si ninguna de estas es la causa de la hematuria, quizs sea Chartered loss adjuster ms estudios para identificar la causa de los sntomas que presenta. Es muy importante que le informe a su mdico si ve sangre en la orina, incluso si no tiene dolor o si el sangrado  desaparece sin tratamiento. La sangre en la orina, que desaparece y vuelve a Arts administrator, puede ser un sntoma de una enfermedad muy grave, incluido Science writer. No se manifiesta dolor en las etapas iniciales de muchos tipos de cncer urinarios. Siga estas indicaciones en su casa: Medicamentos  [employer] de venta libre y los recetados solamente como se lo haya indicado el mdico.  Si le recetaron un antibitico, tmelo como se lo haya indicado el mdico. No deje de tomar el antibitico aunque comience a sentirse mejor. Comida y bebida  Beba suficiente lquido para mantener la orina clara o de color amarillo plido. Se recomienda que beba de 3a4cuartos de galn (de 2,8a3,8l) por da. Si le han diagnosticado una infeccin, se recomienda beber jugo de [place], adems de grandes cantidades de agua.  Evite la cafena, el t y las bebidas gaseosas. Estas sustancias tienden a Medical illustrator.  Evite el alcohol, ya que puede irritar la prstata (hombres). Instrucciones generales  Si le han diagnosticado clculos renales, siga las instrucciones de su mdico con respecto a filtrar la orina para Research scientist (medical) clculo.  Vace la vejiga con frecuencia. Evite retener la orina durante largos perodos.  Si es  mujer: ? Despus de una deposicin, higiencese desde adelante hacia atrs y use cada trozo de papel higinico por nica vez. ? Vaciar la vejiga antes y despus de Clinical biochemist.  Est atento a cualquier cambio en los sntomas. Informe al mdico sobre cualquier cambio o sntoma nuevo.  Es su responsabilidad retirar el resultado del [place]. Consulte a su mdico o en el departamento donde se realice el estudio cundo estarn [employer].  Concurra a todas las visitas de seguimiento como se lo haya indicado el mdico. Esto es importante. Comunquese con un mdico si:  Siente dolor en la espalda.  Tiene fiebre.  Tiene nuseas o vmitos.  Los sntomas no  mejoran despus de 3das.  Los sntomas empeoran. Solicite ayuda de inmediato si:  Presenta muchos vmitos y no puede tomar los medicamentos sin vomitar.  Siente dolor intenso en la espalda o el abdomen, a pesar de [employer].  Tiene una gran cantidad de sangre en la orina.  Despide cogulos de [employer].  Se siente muy dbil o como si fuera a desmayarse.  Se desmaya. Resumen  La hematuria es la presencia de sangre en la orina. Puede tener muchas causas posibles.  Es muy importante que le informe a su mdico si ve sangre en la orina, incluso si no tiene dolor o si el sangrado desaparece sin tratamiento.  Tome los medicamentos de venta libre y los recetados solamente como se lo haya indicado el mdico.  Beba suficiente lquido para Theatre manager la orina clara o de color amarillo plido. Esta informacin no tiene Marine scientist el consejo del mdico. Asegrese de hacerle al mdico cualquier pregunta que tenga. Document Released: 04/21/2005 Document Revised: 12/15/2016 Document Reviewed: 12/15/2016 Elsevier Patient Education  2020 Elsevier Inc.      Agustina Caroli, MD [facility]

## 2019-04-21 LAB — CBC WITH DIFFERENTIAL/PLATELET
Basophils Absolute: 0 10*3/uL (ref 0.0–0.2)
Basos: 1 %
EOS (ABSOLUTE): 0.2 10*3/uL (ref 0.0–0.4)
Eos: 3 %
Hematocrit: 48.6 % (ref 37.5–51.0)
Hemoglobin: 16.8 g/dL (ref 13.0–17.7)
Immature Grans (Abs): 0 10*3/uL (ref 0.0–0.1)
Immature Granulocytes: 0 %
Lymphocytes Absolute: 1.8 10*3/uL (ref 0.7–3.1)
Lymphs: 23 %
MCH: 31.1 pg (ref 26.6–33.0)
MCHC: 34.6 g/dL (ref 31.5–35.7)
MCV: 90 fL (ref 79–97)
Monocytes Absolute: 0.4 10*3/uL (ref 0.1–0.9)
Monocytes: 6 %
Neutrophils Absolute: 5.3 10*3/uL (ref 1.4–7.0)
Neutrophils: 67 %
Platelets: 291 10*3/uL (ref 150–450)
RBC: 5.4 x10E6/uL (ref 4.14–5.80)
RDW: 12.8 % (ref 11.6–15.4)
WBC: 7.9 10*3/uL (ref 3.4–10.8)

## 2019-04-21 LAB — COMPREHENSIVE METABOLIC PANEL
ALT: 26 IU/L (ref 0–44)
AST: 33 IU/L (ref 0–40)
Albumin/Globulin Ratio: 2.2 (ref 1.2–2.2)
Albumin: 5 g/dL — ABNORMAL HIGH (ref 3.8–4.9)
Alkaline Phosphatase: 88 IU/L (ref 39–117)
BUN/Creatinine Ratio: 15 (ref 10–24)
BUN: 11 mg/dL (ref 8–27)
Bilirubin Total: 0.4 mg/dL (ref 0.0–1.2)
CO2: 24 mmol/L (ref 20–29)
Calcium: 9.5 mg/dL (ref 8.6–10.2)
Chloride: 100 mmol/L (ref 96–106)
Creatinine, Ser: 0.73 mg/dL — ABNORMAL LOW (ref 0.76–1.27)
GFR calc Af Amer: 117 mL/min/{1.73_m2} (ref >59)
GFR calc non Af Amer: 101 mL/min/{1.73_m2} (ref >59)
Globulin, Total: 2.3 g/dL (ref 1.5–4.5)
Glucose: 92 mg/dL (ref 65–99)
Potassium: 3.9 mmol/L (ref 3.5–5.2)
Sodium: 140 mmol/L (ref 134–144)
Total Protein: 7.3 g/dL (ref 6.0–8.5)

## 2019-04-22 LAB — URINE CULTURE: Organism ID, Bacteria: NO GROWTH

## 2019-05-13 ENCOUNTER — Telehealth: Payer: Self-pay | Admitting: Emergency Medicine

## 2019-05-13 NOTE — Telephone Encounter (Signed)
Pt would like his referral for CT   [id number] - CT ABDOMEN PELVIS WO CONTRAST  To change locations to [facility] office number [phone]  Please advise   Pt request a call back [phone]

## 2019-05-16 NOTE — Telephone Encounter (Signed)
Can this be referral be changed to have CT done in [place] per patient request? No sure if Prior Auth needed to be changed first.

## 2019-05-18 NOTE — Telephone Encounter (Signed)
Pt came in referral needs to be sent to [place] / Lenna Sciara needs a signed order faxed to her at [phone].  Marland Kitchen Please conract t patient when this is done FR

## 2019-05-18 NOTE — Telephone Encounter (Signed)
Thanks

## 2019-05-18 NOTE — Telephone Encounter (Signed)
Attempted to call pt and let him know that we are working on it. Referral has been sent electronically to pt preference and will fax hard copy once provider signs it.

## 2019-05-18 NOTE — Telephone Encounter (Signed)
I have called pt back with interpreter and informed him that the referral for CT imaging has been sent.  Thanks

## 2019-05-18 NOTE — Telephone Encounter (Signed)
I tried to explain to him what the message from 05/18/19 said. He would like a cb explaining the previous message. [phone]

## 2019-06-06 ENCOUNTER — Telehealth: Payer: Self-pay | Admitting: Emergency Medicine

## 2019-06-06 NOTE — Telephone Encounter (Signed)
We do not see the results of CT scan I will give [facility] a called and once we get results we will give him a call with appt

## 2019-06-06 NOTE — Telephone Encounter (Signed)
Called [facility] to get the results for this patient whom had CT scan 1 week ago and we do not have results at this time. Patient was informed we will give him a call once we find out the reults

## 2019-06-06 NOTE — Telephone Encounter (Signed)
Pt calling in regards to his ct results, pt would also like a paper copy for his records  Please advise

## 2019-06-13 ENCOUNTER — Other Ambulatory Visit: Payer: Self-pay | Admitting: Emergency Medicine

## 2019-06-13 DIAGNOSIS — R319 Hematuria, unspecified: Secondary | ICD-10-CM

## 2019-06-13 DIAGNOSIS — N23 Unspecified renal colic: Secondary | ICD-10-CM

## 2019-06-13 DIAGNOSIS — Z87442 Personal history of urinary calculi: Secondary | ICD-10-CM

## 2019-06-13 NOTE — Telephone Encounter (Signed)
Referral placed.

## 2019-06-13 NOTE — Telephone Encounter (Signed)
Patient is here in office requesting to speak with you. Patient received a denial letter stating the CT was denied. Patient Ct do show stones and patient need a referral to go see Urology that is in network which is  Dr Roxy Horseman number [phone]. Please advise

## 2019-06-13 NOTE — Telephone Encounter (Signed)
Needs referral to urologist listed.

## 2019-06-13 NOTE — Progress Notes (Signed)
If approved by you on referral to Urology. Order for referral has been pended at bottom

## 2019-06-24 ENCOUNTER — Other Ambulatory Visit: Payer: Self-pay | Admitting: Urology

## 2019-06-27 ENCOUNTER — Other Ambulatory Visit: Payer: Self-pay

## 2019-06-27 ENCOUNTER — Encounter (HOSPITAL_COMMUNITY): Payer: Self-pay

## 2019-06-27 ENCOUNTER — Encounter (HOSPITAL_COMMUNITY)
Admission: RE | Admit: 2019-06-27 | Discharge: 2019-06-27 | Disposition: A | Discharge status: Home / Self Care | Payer: 59 | Source: Ambulatory Visit | Attending: Urology | Admitting: Urology

## 2019-06-27 DIAGNOSIS — Z01818 Encounter for other preprocedural examination: Secondary | ICD-10-CM | POA: Diagnosis present

## 2019-06-27 HISTORY — DX: Other complications of anesthesia, initial encounter: T88.59XA

## 2019-06-27 HISTORY — DX: Nausea with vomiting, unspecified: R11.2

## 2019-06-27 HISTORY — DX: Personal history of urinary calculi: Z87.442

## 2019-06-27 HISTORY — DX: Other specified postprocedural states: Z98.890

## 2019-06-27 NOTE — Patient Instructions (Addendum)
DUE TO COVID-19 ONLY ONE VISITOR IS ALLOWED TO COME WITH YOU AND STAY IN THE WAITING ROOM ONLY DURING PRE OP AND PROCEDURE DAY OF SURGERY. THE 1 VISITOR MAY VISIT WITH YOU AFTER SURGERY IN YOUR PRIVATE ROOM DURING VISITING HOURS ONLY!  YOU NEED TO HAVE A COVID 19 TEST ON: 2/25 @  3:00 pm , THIS TEST MUST BE DONE BEFORE SURGERY, COME  [address] , [id number].  ([place]) ONCE YOUR COVID TEST IS COMPLETED, PLEASE BEGIN THE QUARANTINE INSTRUCTIONS AS OUTLINED IN YOUR HANDOUT.                John [address]     Your procedure is scheduled on: 07/04/19   Report to [facility] Main  Entrance   Report to admitting at: 1:45  PM     Call this number if you have problems the morning of surgery [phone]    Remember:   [place], NO [place].     Take these medicines the morning of surgery with A SIP OF WATER: AMLODIPINE.                                 You may not have any metal on your body including hair pins and              piercings  Do not wear jewelry, lotions, powders or perfumes, deodorant             Men may shave face and neck.     Do not bring valuables to the hospital. [place].  Contacts, dentures or bridgework may not be worn into surgery.  Leave suitcase in the car. After surgery it may be brought to your room.     Patients discharged the day of surgery will not be allowed to drive home. IF YOU ARE HAVING SURGERY AND GOING HOME THE SAME DAY, YOU MUST HAVE AN ADULT TO DRIVE YOU HOME AND BE WITH YOU FOR 24 HOURS. YOU MAY GO HOME BY TAXI OR UBER OR ORTHERWISE, BUT AN ADULT MUST ACCOMPANY YOU HOME AND STAY WITH YOU FOR 24 HOURS.  Name and phone number of your driver:  Special Instructions: N/A              Please read over the following fact sheets you were  given: _____________________________________________________________________             NO SOLID FOOD AFTER MIDNIGHT THE NIGHT PRIOR TO SURGERY. NOTHING BY MOUTH EXCEPT CLEAR LIQUIDS UNTIL : 12:45 AM.     CLEAR LIQUID DIET   Foods Allowed                                                                     Foods Excluded  Coffee and tea, regular and decaf                             liquids that you  cannot  Plain Jell-O any favor except red or purple                                           see through such as: Fruit ices (not with fruit pulp)                                     milk, soups, orange juice  Iced Popsicles                                    All solid food Carbonated beverages, regular and diet                                    Cranberry, grape and apple juices Sports drinks like Gatorade Lightly seasoned clear broth or consume(fat free) Sugar, honey syrup  Sample Menu Breakfast                                Lunch                                     Supper Cranberry juice                    Beef broth                            Chicken broth Jell-O                                     Grape juice                           Apple juice Coffee or tea                        Jell-O                                      Popsicle                                                Coffee or tea                        Coffee or tea  _____________________________________________________________________  [facility] - Preparing for Surgery Before surgery, you can play an important role.  Because skin is not sterile, your skin needs to be as free of germs as possible.  You can reduce the number of germs on your skin by washing with CHG (chlorahexidine gluconate) soap before surgery.  CHG is an antiseptic cleaner which kills germs and bonds  with the skin to continue killing germs even after washing. Please DO NOT use if you have an allergy to CHG or antibacterial soaps.  If your skin  becomes reddened/irritated stop using the CHG and inform your nurse when you arrive at Short Stay. Do not shave (including legs and underarms) for at least 48 hours prior to the first CHG shower.  You may shave your face/neck. Please follow these instructions carefully:  1.  Shower with CHG Soap the night before surgery and the  morning of Surgery.  2.  If you choose to wash your hair, wash your hair first as usual with your  normal  shampoo.  3.  After you shampoo, rinse your hair and body thoroughly to remove the  shampoo.                           4.  Use CHG as you would any other liquid soap.  You can apply chg directly  to the skin and wash                       Gently with a scrungie or clean washcloth.  5.  Apply the CHG Soap to your body ONLY FROM THE NECK DOWN.   Do not use on face/ open                           Wound or open sores. Avoid contact with eyes, ears mouth and genitals (private parts).                       Wash face,  Genitals (private parts) with your normal soap.             6.  Wash thoroughly, paying special attention to the area where your surgery  will be performed.  7.  Thoroughly rinse your body with warm water from the neck down.  8.  DO NOT shower/wash with your normal soap after using and rinsing off  the CHG Soap.                9.  Pat yourself dry with a clean towel.            10.  Wear clean pajamas.            11.  Place clean sheets on your bed the night of your first shower and do not  sleep with pets. Day of Surgery : Do not apply any lotions/deodorants the morning of surgery.  Please wear clean clothes to the hospital/surgery center.  FAILURE TO FOLLOW THESE INSTRUCTIONS MAY RESULT IN THE CANCELLATION OF YOUR SURGERY PATIENT SIGNATURE_________________________________  NURSE SIGNATURE__________________________________  ________________________________________________________________________

## 2019-06-28 ENCOUNTER — Other Ambulatory Visit (HOSPITAL_COMMUNITY): Payer: 59

## 2019-06-29 ENCOUNTER — Other Ambulatory Visit: Payer: Self-pay

## 2019-06-29 ENCOUNTER — Encounter (HOSPITAL_COMMUNITY)
Admission: RE | Admit: 2019-06-29 | Discharge: 2019-06-29 | Disposition: A | Discharge status: Home / Self Care | Payer: 59 | Source: Ambulatory Visit | Attending: Urology | Admitting: Urology

## 2019-06-29 DIAGNOSIS — Z01818 Encounter for other preprocedural examination: Secondary | ICD-10-CM | POA: Diagnosis not present

## 2019-06-29 LAB — BASIC METABOLIC PANEL
Anion gap: 11 (ref 5–15)
BUN: 21 mg/dL — ABNORMAL HIGH (ref 6–20)
CO2: 24 mmol/L (ref 22–32)
Calcium: 9 mg/dL (ref 8.9–10.3)
Chloride: 105 mmol/L (ref 98–111)
Creatinine, Ser: 0.86 mg/dL (ref 0.61–1.24)
GFR calc Af Amer: >60 mL/min (ref >60)
GFR calc non Af Amer: >60 mL/min (ref >60)
Glucose, Bld: 109 mg/dL — ABNORMAL HIGH (ref 70–99)
Potassium: 3.7 mmol/L (ref 3.5–5.1)
Sodium: 140 mmol/L (ref 135–145)

## 2019-06-29 LAB — CBC
HCT: 45.6 % (ref 39.0–52.0)
Hemoglobin: 14.9 g/dL (ref 13.0–17.0)
MCH: 30.2 pg (ref 26.0–34.0)
MCHC: 32.7 g/dL (ref 30.0–36.0)
MCV: 92.3 fL (ref 80.0–100.0)
Platelets: 272 10*3/uL (ref 150–400)
RBC: 4.94 MIL/uL (ref 4.22–5.81)
RDW: 12.9 % (ref 11.5–15.5)
WBC: 7.7 10*3/uL (ref 4.0–10.5)
nRBC: 0 % (ref 0.0–0.2)

## 2019-06-29 NOTE — Patient Instructions (Addendum)
DUE TO COVID-19 ONLY ONE VISITOR IS ALLOWED TO COME WITH YOU AND STAY IN THE WAITING ROOM ONLY DURING PRE OP AND PROCEDURE DAY OF SURGERY. THE 1 VISITOR MAY VISIT WITH YOU AFTER SURGERY IN YOUR PRIVATE ROOM DURING VISITING HOURS ONLY!  YOU NEED TO HAVE A COVID 19 TEST ON 06/30/19 @ 3:00 PM, THIS TEST MUST BE DONE BEFORE SURGERY, COME  [address], [place] , [id number].  ([place]) ONCE YOUR COVID TEST IS COMPLETED, PLEASE BEGIN THE QUARANTINE INSTRUCTIONS AS OUTLINED IN YOUR HANDOUT.                Labron Kilian     Your procedure is scheduled on: 07/04/19   Report to [facility] Main  Entrance   Report to admitting at: 1:45 PM     Call this number if you have problems the morning of surgery [phone]    Remember: Do not eat food or drink liquids :After Midnight. BRUSH YOUR TEETH MORNING OF SURGERY AND RINSE YOUR MOUTH OUT, NO CHEWING GUM CANDY OR MINTS.     Take these medicines the morning of surgery with A SIP OF WATER: AMLODIPINE  NO SOLID FOOD AFTER MIDNIGHT THE NIGHT PRIOR TO SURGERY. NOTHING BY MOUTH EXCEPT CLEAR LIQUIDS UNTIL: 12:45 PM .    CLEAR LIQUID DIET   Foods Allowed                                                                     Foods Excluded  Coffee and tea, regular and decaf                             liquids that you cannot  Plain Jell-O any favor except red or purple                                           see through such as: Fruit ices (not with fruit pulp)                                     milk, soups, orange juice  Iced Popsicles                                    All solid food Carbonated beverages, regular and diet                                    Cranberry, grape and apple juices Sports drinks like Gatorade Lightly seasoned clear broth or consume(fat free) Sugar, honey syrup  Sample Menu Breakfast                                Lunch  Supper Cranberry juice                     Beef broth                            Chicken broth Jell-O                                     Grape juice                           Apple juice Coffee or tea                        Jell-O                                      Popsicle                                                Coffee or tea                        Coffee or tea  _____________________________________________________________________                                 Dennis Bast may not have any metal on your body including hair pins and              piercings  Do not wear jewelry,  lotions, powders or perfumes, deodorant            .Men may shave face and neck.   Do not bring valuables to the hospital. [place].  Contacts, dentures or bridgework may not be worn into surgery.  Leave suitcase in the car. After surgery it may be brought to your room.     Patients discharged the day of surgery will not be allowed to drive home. IF YOU ARE HAVING SURGERY AND GOING HOME THE SAME DAY, YOU MUST HAVE AN ADULT TO DRIVE YOU HOME AND BE WITH YOU FOR 24 HOURS. YOU MAY GO HOME BY TAXI OR UBER OR ORTHERWISE, BUT AN ADULT MUST ACCOMPANY YOU HOME AND STAY WITH YOU FOR 24 HOURS.  Name and phone number of your driver:  Special Instructions: N/A              Please read over the following fact sheets you were given: _____________________________________________________________________             [facility] - Preparing for Surgery Before surgery, you can play an important role.  Because skin is not sterile, your skin needs to be as free of germs as possible.  You can reduce the number of germs on your skin by washing with CHG (chlorahexidine gluconate) soap before surgery.  CHG is an antiseptic cleaner which kills germs and bonds with the skin to continue killing germs even after washing. Please DO NOT use if  you have an allergy to CHG or antibacterial soaps.  If your skin becomes  reddened/irritated stop using the CHG and inform your nurse when you arrive at Short Stay. Do not shave (including legs and underarms) for at least 48 hours prior to the first CHG shower.  You may shave your face/neck. Please follow these instructions carefully:  1.  Shower with CHG Soap the night before surgery and the  morning of Surgery.  2.  If you choose to wash your hair, wash your hair first as usual with your  normal  shampoo.  3.  After you shampoo, rinse your hair and body thoroughly to remove the  shampoo.                           4.  Use CHG as you would any other liquid soap.  You can apply chg directly  to the skin and wash                       Gently with a scrungie or clean washcloth.  5.  Apply the CHG Soap to your body ONLY FROM THE NECK DOWN.   Do not use on face/ open                           Wound or open sores. Avoid contact with eyes, ears mouth and genitals (private parts).                       Wash face,  Genitals (private parts) with your normal soap.             6.  Wash thoroughly, paying special attention to the area where your surgery  will be performed.  7.  Thoroughly rinse your body with warm water from the neck down.  8.  DO NOT shower/wash with your normal soap after using and rinsing off  the CHG Soap.                9.  Pat yourself dry with a clean towel.            10.  Wear clean pajamas.            11.  Place clean sheets on your bed the night of your first shower and do not  sleep with pets. Day of Surgery : Do not apply any lotions/deodorants the morning of surgery.  Please wear clean clothes to the hospital/surgery center.  FAILURE TO FOLLOW THESE INSTRUCTIONS MAY RESULT IN THE CANCELLATION OF YOUR SURGERY PATIENT SIGNATURE_________________________________  NURSE SIGNATURE__________________________________  ________________________________________________________________________

## 2019-06-29 NOTE — Progress Notes (Signed)
PCP - Agustina Caroli. LOV: 04/20/19 Cardiologist -   Chest x-ray -  EKG -  Stress Test -  ECHO -  Cardiac Cath -   Sleep Study -  CPAP -   Fasting Blood Sugar -  Checks Blood Sugar _____ times a day  Blood Thinner Instructions: Aspirin Instructions: Last Dose:  Anesthesia review:   Patient denies shortness of breath, fever, cough and chest pain at PAT appointment   Patient verbalized understanding of instructions that were given to them at the PAT appointment. Patient was also instructed that they will need to review over the PAT instructions again at home before surgery.

## 2019-06-30 ENCOUNTER — Other Ambulatory Visit (HOSPITAL_COMMUNITY)
Admission: RE | Admit: 2019-06-30 | Discharge: 2019-06-30 | Disposition: A | Discharge status: Home / Self Care | Payer: 59 | Source: Ambulatory Visit | Attending: Urology | Admitting: Urology

## 2019-06-30 DIAGNOSIS — Z20822 Contact with and (suspected) exposure to covid-19: Secondary | ICD-10-CM | POA: Diagnosis not present

## 2019-06-30 DIAGNOSIS — Z01812 Encounter for preprocedural laboratory examination: Secondary | ICD-10-CM | POA: Insufficient documentation

## 2019-06-30 LAB — SARS CORONAVIRUS 2 (TAT 6-24 HRS): SARS Coronavirus 2: NEGATIVE

## 2019-07-01 NOTE — H&P (Signed)
Office Visit Report     06/15/2019   --------------------------------------------------------------------------------   John Wall  MRN: [id number]  DOB: 1960-07-24, 59 year old Male  SSN:    PRIMARY CARE:    REFERRING:  Agustina Caroli, MD  PROVIDER:  Raynelle Bring, M.D.  LOCATION:  [facility] [phone]     --------------------------------------------------------------------------------   CC/HPI: Hematuria and left flank pain   [name] is a 59 year old gentleman with a history of a prior kidney stone approximately 8 or 9 years ago that required what sounds like shockwave lithotripsy by Dr. Solon Augusta in [facility]. He has not had any other kidney stone episodes. In December, he developed the acute onset of left-sided flank pain with some radiation to his left lower quadrant. This pain is also going down into his left testicle. His pain has been intermittent and not associated with position. He did pass a large warm like clot in January. This prompted a CT scan of the abdomen pelvis which he brings with him today. This was performed [facility]. This demonstrated a 3 x 5 mm left lower pole renal calculus with other small nonobstructing bilateral renal calculi. No ureteral calculi were identified. He has continued to have intermittent pain since his CT scan. His most recent pain episode was approximately 2 days ago. He denies associated nausea and vomiting. He denies fever.     ALLERGIES: Shellfish Sulfa    MEDICATIONS: Amlodipine Besylate 5 mg tablet  Multivitamin     GU PSH: ESWL, Left Varicocele repair, Left     NON-GU PSH: Ankle Arthroscopy/surgery, Left Appendectomy. Nose Surgery (Unspecified)     GU PMH: Urinary Calculus, Unspec Varicocele    NON-GU PMH: Anxiety Arthritis Hypertension    FAMILY HISTORY: Kidney Stones - Brother   SOCIAL HISTORY: Marital Status: Married Preferred Language: Spanish; Castilian; Ethnicity: ; Race:  Other Race Current Smoking Status: Patient has never smoked.   Tobacco Use Assessment Completed: Used Tobacco in last 30 days? Does not use smokeless tobacco. Does drink.  Does not use drugs. Drinks 2 caffeinated drinks per day.    REVIEW OF SYSTEMS:    GU Review Male:   Patient reports get up at night to urinate. Patient denies frequent urination, hard to postpone urination, burning/ pain with urination, leakage of urine, stream starts and stops, trouble starting your streams, and have to strain to urinate .  Gastrointestinal (Upper):   Patient denies nausea and vomiting.  Gastrointestinal (Lower):   Patient denies diarrhea and constipation.  Constitutional:   Patient denies fever, night sweats, weight loss, and fatigue.  Skin:   Patient denies skin rash/ lesion and itching.  Eyes:   Patient denies blurred vision and double vision.  Ears/ Nose/ Throat:   Patient denies sore throat and sinus problems.  Hematologic/Lymphatic:   Patient denies swollen glands and easy bruising.  Cardiovascular:   Patient denies leg swelling and chest pains.  Respiratory:   Patient denies cough and shortness of breath.  Endocrine:   Patient denies excessive thirst.  Musculoskeletal:   Patient reports back pain. Patient denies joint pain.  Neurological:   Patient denies headaches and dizziness.  Psychologic:   Patient denies depression and anxiety.   Notes: left side back pain. intermittent left testicle pain. Pt has noticed some blood clots in the urine.     VITAL SIGNS:      06/15/2019 10:24 AM  Weight 162 lb / 73.48 kg  Height 67 in / 170.18 cm  BP 148/86 mmHg  Pulse 87 /min  Temperature 98.7 F / 37.0 C  BMI 25.4 kg/m   MULTI-SYSTEM PHYSICAL EXAMINATION:    Constitutional: Well-nourished. No physical deformities. Normally developed. Good grooming.  Neck: Neck symmetrical, not swollen. Normal tracheal position.  Respiratory: No labored breathing, no use of accessory muscles. Clear bilaterally.   Cardiovascular: Normal temperature, normal extremity pulses, no swelling, no varicosities. Regular rate and rhythm.  Lymphatic: No enlargement of neck, axillae, groin.  Skin: No paleness, no jaundice, no cyanosis. No lesion, no ulcer, no rash.  Neurologic / Psychiatric: Oriented to time, oriented to place, oriented to person. No depression, no anxiety, no agitation.  Gastrointestinal: No mass, no tenderness, no rigidity, non obese abdomen. No CVA tenderness.  Eyes: Normal conjunctivae. Normal eyelids.  Ears, Nose, Mouth, and Throat: Left ear no scars, no lesions, no masses. Right ear no scars, no lesions, no masses. Nose no scars, no lesions, no masses. Normal hearing. Normal lips.  Musculoskeletal: Normal gait and station of head and neck.     PAST DATA REVIEWED:  Source Of History:  Patient  X-Ray Review: C.T. Abdomen/Pelvis: Reviewed Films.    Notes:                     I independently reviewed his outside CT scan. Findings are as dictated above.   PROCEDURES:          Urinalysis Dipstick Dipstick Cont'd  Color: Yellow Bilirubin: Neg mg/dL  Appearance: Clear Ketones: Neg mg/dL  Specific Gravity: 1.015 Blood: Neg ery/uL  pH: 6.5 Protein: Neg mg/dL  Glucose: Neg mg/dL Urobilinogen: 0.2 mg/dL    Nitrites: Neg    Leukocyte Esterase: Neg leu/uL    ASSESSMENT:      ICD-10 Details  1 GU:   Renal calculus - N20.0   2   Gross hematuria - R31.0    PLAN:           Schedule Return Visit/Planned Activity: Other See Visit Notes             Note: Will call to schedule surgery.          Document Letter(s):  Created for Patient: Clinical Summary         Notes:   1. Left flank/abdominal pain: His symptoms are consistent with a ureteral calculus although he has no objective findings on his CT scan to support this finding. It is possible that he could have some clot obstruction of the left kidney although he did not have significant dilation of his ureter on his imaging either. It is  difficult to definitively determine whether his pain is related to a urologic cause at this time.   2. Left renal calculus/hematuria: I did recommend that he undergo further evaluation of his hematuria which is a concern. We discussed the option of proceeding with an outpatient evaluation including a hematuria protocol CT scan and cystoscopy vs. the option of proceeding with cystoscopy and retrograde pyelography as an outpatient surgical procedure. Considering his ongoing left-sided flank pain and the presence of a left renal calculus which could potentially be intermittently obstructing, we discussed the option of performing left-sided ureteroscopy at the time of this procedure along with removal of his left renal calculus. Ultimately, this seemed to be the best approach to definitively evaluate his hematuria, flank pain, and deal with his left renal calculus that was present.   He will therefore be scheduled for cystoscopy, bilateral retrograde pyelography, left ureteroscopy, and removal of  left ureteral stone versus laser lithotripsy, and left ureteral stent placement. We have reviewed the potential risks, potential side effects, and expected recovery process. He gives informed consent to proceed.   Currently, he feels comfortable using ibuprofen as needed for pain. He will notify me if he needs additional pain medication.   Cc: Dr. Agustina Caroli     * Signed by Raynelle Bring, M.D. on 06/15/19 at 8:17 PM (EST*

## 2019-07-04 ENCOUNTER — Ambulatory Visit (HOSPITAL_COMMUNITY): Payer: 59

## 2019-07-04 ENCOUNTER — Encounter (HOSPITAL_COMMUNITY)
Admission: RE | Disposition: A | Discharge status: Home / Self Care | Payer: Self-pay | Source: Other Acute Inpatient Hospital | Attending: Urology

## 2019-07-04 ENCOUNTER — Encounter (HOSPITAL_COMMUNITY): Payer: Self-pay | Admitting: Urology

## 2019-07-04 ENCOUNTER — Ambulatory Visit (HOSPITAL_COMMUNITY): Payer: 59 | Admitting: Anesthesiology

## 2019-07-04 ENCOUNTER — Other Ambulatory Visit: Payer: Self-pay

## 2019-07-04 ENCOUNTER — Ambulatory Visit (HOSPITAL_COMMUNITY): Payer: 59 | Admitting: Physician Assistant

## 2019-07-04 ENCOUNTER — Ambulatory Visit (HOSPITAL_COMMUNITY)
Admission: RE | Admit: 2019-07-04 | Discharge: 2019-07-04 | Disposition: A | Discharge status: Home / Self Care | Payer: 59 | Source: Other Acute Inpatient Hospital | Attending: Urology | Admitting: Urology

## 2019-07-04 DIAGNOSIS — Z87442 Personal history of urinary calculi: Secondary | ICD-10-CM | POA: Insufficient documentation

## 2019-07-04 DIAGNOSIS — Z882 Allergy status to sulfonamides status: Secondary | ICD-10-CM | POA: Diagnosis not present

## 2019-07-04 DIAGNOSIS — Z841 Family history of disorders of kidney and ureter: Secondary | ICD-10-CM | POA: Insufficient documentation

## 2019-07-04 DIAGNOSIS — N200 Calculus of kidney: Secondary | ICD-10-CM | POA: Diagnosis present

## 2019-07-04 DIAGNOSIS — F419 Anxiety disorder, unspecified: Secondary | ICD-10-CM | POA: Diagnosis not present

## 2019-07-04 DIAGNOSIS — R310 Gross hematuria: Secondary | ICD-10-CM | POA: Insufficient documentation

## 2019-07-04 DIAGNOSIS — I10 Essential (primary) hypertension: Secondary | ICD-10-CM | POA: Insufficient documentation

## 2019-07-04 DIAGNOSIS — M1990 Unspecified osteoarthritis, unspecified site: Secondary | ICD-10-CM | POA: Diagnosis not present

## 2019-07-04 DIAGNOSIS — Z79899 Other long term (current) drug therapy: Secondary | ICD-10-CM | POA: Diagnosis not present

## 2019-07-04 HISTORY — PX: CYSTOSCOPY/URETEROSCOPY/HOLMIUM LASER/STENT PLACEMENT: SHX6546

## 2019-07-04 HISTORY — PX: HOLMIUM LASER APPLICATION: SHX5852

## 2019-07-04 SURGERY — CYSTOSCOPY/URETEROSCOPY/HOLMIUM LASER/STENT PLACEMENT
Anesthesia: General | Laterality: Left

## 2019-07-04 MED ORDER — KETOROLAC TROMETHAMINE 30 MG/ML IJ SOLN: 30.0000 mg | Freq: Once | INTRAMUSCULAR | Status: AC | PRN

## 2019-07-04 MED ORDER — IOHEXOL 300 MG/ML  SOLN
INTRAMUSCULAR | Status: DC | PRN
  Administered 2019-07-04: 16:00:00 24 mL

## 2019-07-04 MED ORDER — KETOROLAC TROMETHAMINE 30 MG/ML IJ SOLN
INTRAMUSCULAR | Status: AC
  Administered 2019-07-04: 30 mg via INTRAVENOUS
  Filled 2019-07-04: qty 1

## 2019-07-04 MED ORDER — SODIUM CHLORIDE 0.9 % IR SOLN
Status: DC | PRN
  Administered 2019-07-04: 3000 mL

## 2019-07-04 MED ORDER — FENTANYL CITRATE (PF) 100 MCG/2ML IJ SOLN
INTRAMUSCULAR | Status: AC
  Filled 2019-07-04: qty 2

## 2019-07-04 MED ORDER — FENTANYL CITRATE (PF) 100 MCG/2ML IJ SOLN: 25.0000 ug | INTRAMUSCULAR | Status: DC | PRN

## 2019-07-04 MED ORDER — LACTATED RINGERS IV SOLN: INTRAVENOUS | Status: DC

## 2019-07-04 MED ORDER — LIDOCAINE 2% (20 MG/ML) 5 ML SYRINGE
INTRAMUSCULAR | Status: DC | PRN
  Administered 2019-07-04: 100 mg via INTRAVENOUS

## 2019-07-04 MED ORDER — PROMETHAZINE HCL 25 MG/ML IJ SOLN: 6.2500 mg | INTRAMUSCULAR | Status: DC | PRN

## 2019-07-04 MED ORDER — ONDANSETRON HCL 4 MG/2ML IJ SOLN
INTRAMUSCULAR | Status: DC | PRN
  Administered 2019-07-04: 4 mg via INTRAVENOUS

## 2019-07-04 MED ORDER — FENTANYL CITRATE (PF) 100 MCG/2ML IJ SOLN
INTRAMUSCULAR | Status: DC | PRN
  Administered 2019-07-04: 50 ug via INTRAVENOUS

## 2019-07-04 MED ORDER — MIDAZOLAM HCL 2 MG/2ML IJ SOLN
INTRAMUSCULAR | Status: DC | PRN
  Administered 2019-07-04: 2 mg via INTRAVENOUS

## 2019-07-04 MED ORDER — LIDOCAINE 2% (20 MG/ML) 5 ML SYRINGE
INTRAMUSCULAR | Status: AC
  Filled 2019-07-04: qty 5

## 2019-07-04 MED ORDER — EPHEDRINE 5 MG/ML INJ
INTRAVENOUS | Status: AC
  Filled 2019-07-04: qty 10

## 2019-07-04 MED ORDER — ONDANSETRON HCL 4 MG/2ML IJ SOLN
INTRAMUSCULAR | Status: AC
  Filled 2019-07-04: qty 2

## 2019-07-04 MED ORDER — PROPOFOL 10 MG/ML IV BOLUS
INTRAVENOUS | Status: AC
  Filled 2019-07-04: qty 20

## 2019-07-04 MED ORDER — DEXAMETHASONE SODIUM PHOSPHATE 10 MG/ML IJ SOLN
INTRAMUSCULAR | Status: DC | PRN
  Administered 2019-07-04: 10 mg via INTRAVENOUS

## 2019-07-04 MED ORDER — CEFAZOLIN SODIUM-DEXTROSE 2-4 GM/100ML-% IV SOLN
2.0000 g | Freq: Once | INTRAVENOUS | Status: AC
  Administered 2019-07-04: 2 g via INTRAVENOUS
  Filled 2019-07-04: qty 100

## 2019-07-04 MED ORDER — DEXAMETHASONE SODIUM PHOSPHATE 10 MG/ML IJ SOLN
INTRAMUSCULAR | Status: AC
  Filled 2019-07-04: qty 1

## 2019-07-04 MED ORDER — MIDAZOLAM HCL 2 MG/2ML IJ SOLN
INTRAMUSCULAR | Status: AC
  Filled 2019-07-04: qty 2

## 2019-07-04 MED ORDER — EPHEDRINE SULFATE-NACL 50-0.9 MG/10ML-% IV SOSY
PREFILLED_SYRINGE | INTRAVENOUS | Status: DC | PRN
  Administered 2019-07-04: 10 mg via INTRAVENOUS

## 2019-07-04 MED ORDER — PROPOFOL 500 MG/50ML IV EMUL
INTRAVENOUS | Status: DC | PRN
  Administered 2019-07-04: 200 mg via INTRAVENOUS

## 2019-07-04 MED ORDER — HYDROCODONE-ACETAMINOPHEN 5-325 MG PO TABS: 1.0000 | ORAL_TABLET | Freq: Four times a day (QID) | ORAL | 0 refills | Status: DC | PRN

## 2019-07-04 SURGICAL SUPPLY — 22 items
BAG URO CATCHER STRL LF (MISCELLANEOUS) ×2 IMPLANT
BASKET ZERO TIP NITINOL 2.4FR (BASKET) IMPLANT
CATH INTERMIT  6FR 70CM (CATHETERS) ×2 IMPLANT
CLOTH BEACON ORANGE TIMEOUT ST (SAFETY) ×2 IMPLANT
COVER SURGICAL LIGHT HANDLE (MISCELLANEOUS) ×2 IMPLANT
COVER WAND RF STERILE (DRAPES) IMPLANT
FIBER LASER FLEXIVA 365 (UROLOGICAL SUPPLIES) IMPLANT
FIBER LASER TRAC TIP (UROLOGICAL SUPPLIES) ×2 IMPLANT
GLOVE BIOGEL M STRL SZ7.5 (GLOVE) ×2 IMPLANT
GOWN STRL REUS W/TWL LRG LVL3 (GOWN DISPOSABLE) ×4 IMPLANT
GUIDEWIRE ANG ZIPWIRE 038X150 (WIRE) IMPLANT
GUIDEWIRE STR DUAL SENSOR (WIRE) ×2 IMPLANT
IV NS 1000ML (IV SOLUTION) ×1
IV NS 1000ML BAXH (IV SOLUTION) ×1 IMPLANT
KIT TURNOVER KIT A (KITS) IMPLANT
MANIFOLD NEPTUNE II (INSTRUMENTS) ×2 IMPLANT
PACK CYSTO (CUSTOM PROCEDURE TRAY) ×2 IMPLANT
PENCIL SMOKE EVACUATOR (MISCELLANEOUS) IMPLANT
SHEATH URETERAL 12FRX35CM (MISCELLANEOUS) ×2 IMPLANT
STENT URET 6FRX24 CONTOUR (STENTS) ×2 IMPLANT
TUBING CONNECTING 10 (TUBING) ×2 IMPLANT
TUBING UROLOGY SET (TUBING) ×2 IMPLANT

## 2019-07-04 NOTE — Anesthesia Procedure Notes (Signed)
Procedure Name: LMA Insertion Date/Time: 07/04/2019 3:17 PM Performed by: Gerald Leitz, CRNA Pre-anesthesia Checklist: Patient identified, Patient being monitored, Timeout performed, Emergency Drugs available and Suction available Patient Re-evaluated:Patient Re-evaluated prior to induction Oxygen Delivery Method: Circle system utilized Preoxygenation: Pre-oxygenation with 100% oxygen Induction Type: IV induction Ventilation: Mask ventilation without difficulty LMA: LMA inserted LMA Size: 4.0 Tube type: Oral Number of attempts: 2 Placement Confirmation: positive ETCO2 and breath sounds checked- equal and bilateral Tube secured with: Tape Dental Injury: Teeth and Oropharynx as per pre-operative assessment

## 2019-07-04 NOTE — Transfer of Care (Signed)
Immediate Anesthesia Transfer of Care Note  Patient: John Wall  Procedure(s) Performed: Procedure(s): CYSTOSCOPY/RETROGRADE/URETEROSCOPY/HOLMIUM LASER/STENT PLACEMENT (Left) HOLMIUM LASER APPLICATION (Left)  Patient Location: PACU  Anesthesia Type:General  Level of Consciousness: Alert, Awake, Oriented  Airway & Oxygen Therapy: Patient Spontanous Breathing  Post-op Assessment: Report given to RN  Post vital signs: Reviewed and stable  Last Vitals:  Vitals:   07/04/19 1345  BP: (!) (P) 150/90  Pulse: (P) 78  Resp: (P) 16  Temp: (P) 37 C  SpO2: (P) 123XX123    Complications: No apparent anesthesia complications

## 2019-07-04 NOTE — Anesthesia Preprocedure Evaluation (Signed)
Anesthesia Evaluation  Patient identified by MRN, date of birth, ID band Patient awake    Reviewed: Allergy & Precautions, NPO status , Patient's Chart, lab work & pertinent test results  History of Anesthesia Complications (+) PONV  Airway Mallampati: II  TM Distance: >3 FB Neck ROM: Full    Dental no notable dental hx.    Pulmonary neg pulmonary ROS,    Pulmonary exam normal breath sounds clear to auscultation       Cardiovascular hypertension, Pt. on medications Normal cardiovascular exam Rhythm:Regular Rate:Normal     Neuro/Psych negative neurological ROS  negative psych ROS   GI/Hepatic negative GI ROS, Neg liver ROS,   Endo/Other  negative endocrine ROS  Renal/GU negative Renal ROS  negative genitourinary   Musculoskeletal negative musculoskeletal ROS (+)   Abdominal   Peds negative pediatric ROS (+)  Hematology negative hematology ROS (+)   Anesthesia Other Findings   Reproductive/Obstetrics negative OB ROS                             Anesthesia Physical Anesthesia Plan  ASA: II  Anesthesia Plan: General   Post-op Pain Management:    Induction: Intravenous  PONV Risk Score and Plan: 3 and Ondansetron, Dexamethasone and Treatment may vary due to age or medical condition  Airway Management Planned: LMA  Additional Equipment:   Intra-op Plan:   Post-operative Plan: Extubation in OR  Informed Consent: I have reviewed the patients History and Physical, chart, labs and discussed the procedure including the risks, benefits and alternatives for the proposed anesthesia with the patient or authorized representative who has indicated his/her understanding and acceptance.     Dental advisory given  Plan Discussed with: CRNA and Surgeon  Anesthesia Plan Comments:         Anesthesia Quick Evaluation

## 2019-07-04 NOTE — Progress Notes (Signed)
Called Soliman Puleio, patient's wife, to inform her that her husband has just left for surgery. Next person to call her will be Dr Alinda Money. She verbalizes understanding.

## 2019-07-04 NOTE — Interval H&P Note (Signed)
History and Physical Interval Note:  07/04/2019 2:16 PM  John Wall  has presented today for surgery, with the diagnosis of HEMATURIA, LEFT RENAL CALCULUS.  The various methods of treatment have been discussed with the patient and family. After consideration of risks, benefits and other options for treatment, the patient has consented to  Procedure(s): CYSTOSCOPY/RETROGRADE/URETEROSCOPY/HOLMIUM LASER/STENT PLACEMENT (Left) HOLMIUM LASER APPLICATION (Left) as a surgical intervention.  The patient's history has been reviewed, patient examined, no change in status, stable for surgery.  I have reviewed the patient's chart and labs.  Questions were answered to the patient's satisfaction.     [employer]

## 2019-07-04 NOTE — Op Note (Signed)
Preoperative diagnosis: Left renal calculi, Hematuria, Left flank pain  Postoperative diagnosis: Left renal calculi, Hematuria, Left flank pain  Procedure:  1. Cystoscopy 2. Left ureteroscopy and stone removal 3. Ureteroscopic laser lithotripsy 4. Left ureteral stent placement (6 x 24 with string) 5. Bilateral retrograde pyelography with interpretation  Surgeon: Pryor Curia. M.D.  Anesthesia: General  Complications: None  Intraoperative findings: Right retrograde pyelography demonstrated a normal right ureter and renal collecting system without filling defects.  Left retrograde pyelography demonstrated a normal left ureter.  There was a bifid renal collecting system on the left without filling defects.  EBL: Minimal  Specimens: 1. Left renal calculi  Disposition of specimens: [facility] for stone analysis  Indication: John Wall  is a 59 y.o. patient with recent gross hematuria, left flank pain, left nonobstructing renal calculi and a history of urolithiasis.. After reviewing the management options for treatment, they elected to proceed with the above surgical procedure(s). We have discussed the potential benefits and risks of the procedure, side effects of the proposed treatment, the likelihood of the patient achieving the goals of the procedure, and any potential problems that might occur during the procedure or recuperation. Informed consent has been obtained.  Description of procedure:  The patient was taken to the operating room and general anesthesia was induced.  The patient was placed in the dorsal lithotomy position, prepped and draped in the usual sterile fashion, and preoperative antibiotics were administered. A preoperative time-out was performed.   Cystourethroscopy was performed.  The patient's urethra was examined and was normal. The bladder was then systematically examined with both a 30 degree and 70 degree lens in its entirety. There was  no evidence for any bladder tumors, stones, or other mucosal pathology.  He was noted to have evidence of BPH with a small median lobe that was easily friable and appeared to be the likely source for his recent hematuria.  Attention then turned to the right ureteral orifice and a ureteral catheter was used to intubate the ureteral orifice.  Omnipaque contrast was injected through the ureteral catheter and a retrograde pyelogram was performed with findings as dictated above.  Attention then turned to the left ureteral orifice and a ureteral catheter was used to intubate the ureteral orifice.  Omnipaque contrast was injected through the ureteral catheter and a retrograde pyelogram was performed with findings as dictated above.  A 0.38 sensor guidewire was then advanced up the left ureter into the renal pelvis under fluoroscopic guidance.  A 12/14 Fr ureteral access sheath was then advanced over the guide wire. The digital flexible ureteroscope was then advanced through the access sheath into the ureter next to the guidewire and the calculi was identified and was located in the lower pole of the left kidney.  One stone was removed intact with a zero tip nitinol basket.  The only other stone was manipulated into the proximal ureter.   The stone was then fragmented with the 200 micron holmium laser fiber on a setting of 0.6 J and frequency of 6 Hz.   The two stone fragments were then removed with a zero tip nitinol basket.  Reinspection of the ureter/renal pelvis revealed no remaining visible stones or fragments of significant size.   The safety wire was then replaced and the access sheath removed.  The guidewire was backloaded through the cystoscope and a ureteral stent was advance over the wire using Seldinger technique.  The stent was positioned appropriately under fluoroscopic and cystoscopic  guidance.  The wire was then removed with an adequate stent curl noted in the renal pelvis as well as in the  bladder.  The bladder was then emptied and the procedure ended.  The patient appeared to tolerate the procedure well and without complications.  The patient was able to be awakened and transferred to the recovery unit in satisfactory condition.   Pryor Curia MD

## 2019-07-04 NOTE — Progress Notes (Signed)
Pt discharged in NAD, VSS, pain tolerable. Pt given discharge instructions. All questions answered. Reviewed discharge instructions with sons and wife upon discharge. Pt discharged by wheelchair.

## 2019-07-04 NOTE — Anesthesia Postprocedure Evaluation (Signed)
Anesthesia Post Note  Patient: John Wall  Procedure(s) Performed: CYSTOSCOPY/RETROGRADE/URETEROSCOPY/HOLMIUM LASER/STENT PLACEMENT (Left ) HOLMIUM LASER APPLICATION (Left )     Patient location during evaluation: PACU Anesthesia Type: General Level of consciousness: awake and alert Pain management: pain level controlled Vital Signs Assessment: post-procedure vital signs reviewed and stable Respiratory status: spontaneous breathing, nonlabored ventilation, respiratory function stable and patient connected to nasal cannula oxygen Cardiovascular status: blood pressure returned to baseline and stable Postop Assessment: no apparent nausea or vomiting Anesthetic complications: no    Last Vitals:  Vitals:   07/04/19 1645 07/04/19 1655  BP: (!) 150/88 (!) 140/98  Pulse: 81 76  Resp: 14 15  Temp: (!) 36.3 C (!) 36.3 C  SpO2: 98% 100%    Last Pain:  Vitals:   07/04/19 1645  TempSrc:   PainSc: 0-No pain                 Nova Schmuhl S

## 2019-07-04 NOTE — Discharge Instructions (Signed)
1. You may see some blood in the urine and may have some burning with urination for 48-72 hours. You also may notice that you have to urinate more frequently or urgently after your procedure which is normal.  2. You should call should you develop an inability urinate, fever > 101, persistent nausea and vomiting that prevents you from eating or drinking to stay hydrated.  3. If you have a stent, you will likely urinate more frequently and urgently until the stent is removed and you may experience some discomfort/pain in the lower abdomen and flank especially when urinating. You may take pain medication prescribed to you if needed for pain. You may also intermittently have blood in the urine until the stent is removed.       4.   You may remove your stent on Friday morning.  Simply pull the string that is taped to your body and the stent will easily come out.  This may be best done in the shower as some urine may come out with the stent.  Usually you will feel relief once the stent is removed, but occasionally patients can develop pain due to residual swelling of the ureter that may temporarily obstruct the kidney.  This can be managed by taking pain medication and it will typically resolve with time.  Please do not hesitate to call if you have pain that is not controlled with your pain medication or does not improved within 24-48 hours.

## 2019-11-18 ENCOUNTER — Telehealth: Payer: Self-pay

## 2019-11-18 NOTE — Telephone Encounter (Signed)
Patient came into office today to try and establish care with Dr Darnell Level, the patient speaks spanish and his insurance told him about this off and Dr Darnell Level    Please advise if you would like to accept this patient

## 2019-11-21 NOTE — Telephone Encounter (Signed)
Ok to schedule in open 30 min slot.

## 2019-11-22 ENCOUNTER — Telehealth: Payer: Self-pay | Admitting: General Practice

## 2019-11-22 NOTE — Telephone Encounter (Signed)
Patient called and got scheduled for new patient appointment with Dr.Gutierrez. New patient paperwork has been mailed.

## 2019-11-22 NOTE — Telephone Encounter (Signed)
Noted  

## 2019-12-06 ENCOUNTER — Encounter: Payer: Self-pay | Admitting: Family Medicine

## 2019-12-06 ENCOUNTER — Other Ambulatory Visit: Payer: Self-pay

## 2019-12-06 ENCOUNTER — Ambulatory Visit: Payer: 59 | Admitting: Family Medicine

## 2019-12-06 VITALS — BP 164/82 | HR 65 | Temp 97.6°F | Ht 65.5 in | Wt 159.6 lb

## 2019-12-06 DIAGNOSIS — N200 Calculus of kidney: Secondary | ICD-10-CM | POA: Diagnosis not present

## 2019-12-06 DIAGNOSIS — Z131 Encounter for screening for diabetes mellitus: Secondary | ICD-10-CM

## 2019-12-06 DIAGNOSIS — Z125 Encounter for screening for malignant neoplasm of prostate: Secondary | ICD-10-CM

## 2019-12-06 DIAGNOSIS — I10 Essential (primary) hypertension: Secondary | ICD-10-CM

## 2019-12-06 DIAGNOSIS — Z13220 Encounter for screening for lipoid disorders: Secondary | ICD-10-CM | POA: Diagnosis not present

## 2019-12-06 LAB — POC URINALSYSI DIPSTICK (AUTOMATED)
Bilirubin, UA: NEGATIVE
Blood, UA: NEGATIVE
Glucose, UA: NEGATIVE
Ketones, UA: NEGATIVE
Leukocytes, UA: NEGATIVE
Nitrite, UA: NEGATIVE
Protein, UA: NEGATIVE
Spec Grav, UA: 1.015 (ref 1.010–1.025)
Urobilinogen, UA: 0.2 E.U./dL
pH, UA: 7.5 (ref 5.0–8.0)

## 2019-12-06 MED ORDER — AMLODIPINE BESYLATE 5 MG PO TABS: 5.0000 mg | ORAL_TABLET | Freq: Every day | ORAL | 3 refills | Status: DC

## 2019-12-06 NOTE — Progress Notes (Signed)
This visit was conducted in person.  BP (!) 164/82 (BP Location: Right Arm, Patient Position: Sitting, Cuff Size: Normal)   Pulse 65   Temp 97.6 F (36.4 C) (Temporal)   Ht 5' 5.5" (1.664 m)   Wt 159 lb 9 oz (72.4 kg)   SpO2 98%   BMI 26.15 kg/m   BP Readings from Last 3 Encounters:  12/06/19 (!) 164/82  07/04/19 130/90  06/29/19 138/81  BP with home cuff he brings 353I systolic CC: new pt to establish Subjective:    Patient ID: John Wall, male    DOB: 02-Oct-1960, 59 y.o.   MRN: [id number]  HPI: John Wall is a 59 y.o. male presenting on 12/06/2019 for New Patient (Initial Visit) (Pt brought in home BP monitor to compare.  Reading was 151/79 in office today. )   Previously saw Dr Mitchel Honour at [place]. Wanted to change practices. Last CPE 12/2018. From [place].   H/o kidney stones s/p shockwave lithotripsy ~2010 in [facility] (Dr Burnell Blanks). Hematuria presumed due to recurrent kidney stone 04/2019 - CT scan (through [facility]) showed multiple kidney stones s/p cystoscopy, pyelogram, ureteroscopy and L stent placement s/p removal Alinda Money). Urology records reviewed. L flank pain as well as sharp abdominal pain have resolved, as did gross hematuria.   He does note persistent suprapubic discomfort that changes in intensity. Also notes some lower back pain thought MSK related.   HTN - compliant with current antihypertensive regimen of amlodipine 5mg  daily - he actually takes 3/4 tab daily, full dose causes ankle swelling. Attributing elevated blood pressures to stress of coming into office visit - white coat HTN. Does check blood pressures at home: 120-130/70s. No low blood pressure readings or symptoms of dizziness/syncope. Denies HA, vision changes, CP/tightness, SOB, leg swelling.      Relevant past medical, surgical, family and social history reviewed and updated as indicated. Interim medical history since our last visit reviewed. Allergies and medications reviewed and  updated. Outpatient Medications Prior to Visit  Medication Sig Dispense Refill  . Multiple Vitamin (MULTIVITAMIN) tablet Take 1 tablet by mouth daily.    Marland Kitchen amLODipine (NORVASC) 5 MG tablet Take 1 tablet (5 mg total) by mouth daily. 90 tablet 3  . amLODipine (NORVASC) 5 MG tablet Take 5 mg by mouth daily.    . cyclobenzaprine (FLEXERIL) 10 MG tablet Take 1 tablet (10 mg total) by mouth at bedtime. (Patient not taking: Reported on 12/08/2018) 20 tablet 0  . HYDROcodone-acetaminophen (NORCO/VICODIN) 5-325 MG tablet Take 1-2 tablets by mouth every 6 (six) hours as needed. 12 tablet 0   No facility-administered medications prior to visit.     Per HPI unless specifically indicated in ROS section below Review of Systems Objective:  BP (!) 164/82 (BP Location: Right Arm, Patient Position: Sitting, Cuff Size: Normal)   Pulse 65   Temp 97.6 F (36.4 C) (Temporal)   Ht 5' 5.5" (1.664 m)   Wt 159 lb 9 oz (72.4 kg)   SpO2 98%   BMI 26.15 kg/m   Wt Readings from Last 3 Encounters:  12/06/19 159 lb 9 oz (72.4 kg)  07/04/19 163 lb 9.3 oz (74.2 kg)  06/29/19 163 lb 8 oz (74.2 kg)      Physical Exam Vitals and nursing note reviewed.  Constitutional:      Appearance: Normal appearance. He is not ill-appearing.  Eyes:     Extraocular Movements: Extraocular movements intact.     Pupils: Pupils are equal, round, and reactive  to light.  Cardiovascular:     Rate and Rhythm: Normal rate and regular rhythm.     Pulses: Normal pulses.     Heart sounds: Normal heart sounds. No murmur heard.   Pulmonary:     Effort: Pulmonary effort is normal. No respiratory distress.     Breath sounds: Normal breath sounds. No wheezing, rhonchi or rales.  Abdominal:     General: Abdomen is flat. Bowel sounds are normal. There is no distension.     Palpations: Abdomen is soft. There is no mass.     Tenderness: There is abdominal tenderness (mild) in the suprapubic area. There is no right CVA tenderness, left CVA  tenderness, guarding or rebound.     Hernia: No hernia is present.  Musculoskeletal:        General: Normal range of motion.     Right lower leg: No edema.     Left lower leg: No edema.  Skin:    General: Skin is warm and dry.     Findings: No rash.  Neurological:     Mental Status: He is alert.  Psychiatric:        Mood and Affect: Mood normal.        Behavior: Behavior normal.       Results for orders placed or performed in visit on 12/06/19  POCT Urinalysis Dipstick (Automated)  Result Value Ref Range   Color, UA yellow    Clarity, UA clear    Glucose, UA Negative Negative   Bilirubin, UA negative    Ketones, UA negative    Spec Grav, UA 1.015 1.010 - 1.025   Blood, UA negative    pH, UA 7.5 5.0 - 8.0   Protein, UA Negative Negative   Urobilinogen, UA 0.2 0.2 or 1.0 E.U./dL   Nitrite, UA negative    Leukocytes, UA Negative Negative   Assessment & Plan:  This visit occurred during the SARS-CoV-2 public health emergency.  Safety protocols were in place, including screening questions prior to the visit, additional usage of staff PPE, and extensive cleaning of exam room while observing appropriate contact time as indicated for disinfecting solutions.  Will return for fasting labs and CPE shortly.  Problem List Items Addressed This Visit    Nephrolithiasis    H/o recurrent kidney stones, latest episode 04/2019 s/p urological intervention 07/2019 with resolution of symptoms. Urology records reviewed. Stone analysis returned calcium oxalate (65%) and carbonate apatite (35%). Encourage good hydration status, will continue to watch for now. Update UA today r/o ongoing hematuria.       Relevant Orders   POCT Urinalysis Dipstick (Automated) (Completed)   HTN (hypertension) - Primary    Chronic, above goal, with known component of white coat hypertension - anticipate contributing as he's newly establishing at our office today.  Encouraged continue to monitor at home and let me know  if consistently >140/90 at home to make med chages. He continues amlodipine 5mg  daily.       Relevant Medications   amLODipine (NORVASC) 5 MG tablet   Other Relevant Orders   Lipid panel   Comprehensive metabolic panel   TSH   Microalbumin / creatinine urine ratio    Other Visit Diagnoses    Special screening for malignant neoplasm of prostate       Relevant Orders   PSA   Lipid screening       Relevant Orders   Lipid panel   Diabetes mellitus screening  Relevant Orders   Comprehensive metabolic panel       Meds ordered this encounter  Medications  . amLODipine (NORVASC) 5 MG tablet    Sig: Take 1 tablet (5 mg total) by mouth daily.    Dispense:  90 tablet    Refill:  3   Orders Placed This Encounter  Procedures  . Lipid panel    Standing Status:   Future    Standing Expiration Date:   12/13/2020  . Comprehensive metabolic panel    Standing Status:   Future    Standing Expiration Date:   12/13/2020  . TSH    Standing Status:   Future    Standing Expiration Date:   12/13/2020  . PSA    Standing Status:   Future    Standing Expiration Date:   12/13/2020  . Microalbumin / creatinine urine ratio    Standing Status:   Future    Standing Expiration Date:   12/13/2020  . POCT Urinalysis Dipstick (Automated)    Patient Instructions  Urinalysis today.  Gusto verlo hoy.  Regresar para fisico con laboratorios cuando pueda.    Follow up plan: Return if symptoms worsen or fail to improve, for annual exam, prior fasting for blood work.  Ria Bush, MD

## 2019-12-06 NOTE — Patient Instructions (Addendum)
Urinalysis today.  Gusto verlo hoy.  Regresar para fisico con laboratorios cuando pueda.

## 2019-12-08 ENCOUNTER — Encounter: Payer: 59 | Admitting: Emergency Medicine

## 2019-12-14 ENCOUNTER — Encounter: Payer: Self-pay | Admitting: Family Medicine

## 2019-12-14 ENCOUNTER — Other Ambulatory Visit: Payer: Self-pay

## 2019-12-14 ENCOUNTER — Ambulatory Visit (INDEPENDENT_AMBULATORY_CARE_PROVIDER_SITE_OTHER): Payer: 59 | Admitting: Family Medicine

## 2019-12-14 VITALS — BP 126/78 | HR 72 | Temp 97.6°F | Ht 65.5 in | Wt 160.2 lb

## 2019-12-14 DIAGNOSIS — Z13220 Encounter for screening for lipoid disorders: Secondary | ICD-10-CM | POA: Diagnosis not present

## 2019-12-14 DIAGNOSIS — Z0001 Encounter for general adult medical examination with abnormal findings: Secondary | ICD-10-CM | POA: Diagnosis not present

## 2019-12-14 DIAGNOSIS — Z131 Encounter for screening for diabetes mellitus: Secondary | ICD-10-CM

## 2019-12-14 DIAGNOSIS — I10 Essential (primary) hypertension: Secondary | ICD-10-CM | POA: Diagnosis not present

## 2019-12-14 DIAGNOSIS — Z23 Encounter for immunization: Secondary | ICD-10-CM | POA: Diagnosis not present

## 2019-12-14 DIAGNOSIS — N200 Calculus of kidney: Secondary | ICD-10-CM | POA: Insufficient documentation

## 2019-12-14 DIAGNOSIS — Z125 Encounter for screening for malignant neoplasm of prostate: Secondary | ICD-10-CM | POA: Diagnosis not present

## 2019-12-14 DIAGNOSIS — R102 Pelvic and perineal pain: Secondary | ICD-10-CM

## 2019-12-14 DIAGNOSIS — Z0000 Encounter for general adult medical examination without abnormal findings: Secondary | ICD-10-CM | POA: Insufficient documentation

## 2019-12-14 LAB — COMPREHENSIVE METABOLIC PANEL
ALT: 22 U/L (ref 0–53)
AST: 24 U/L (ref 0–37)
Albumin: 4.8 g/dL (ref 3.5–5.2)
Alkaline Phosphatase: 78 U/L (ref 39–117)
BUN: 14 mg/dL (ref 6–23)
CO2: 28 mEq/L (ref 19–32)
Calcium: 9.9 mg/dL (ref 8.4–10.5)
Chloride: 101 mEq/L (ref 96–112)
Creatinine, Ser: 0.81 mg/dL (ref 0.40–1.50)
GFR: 96.91 mL/min (ref >60.00)
Glucose, Bld: 95 mg/dL (ref 70–99)
Potassium: 4.4 mEq/L (ref 3.5–5.1)
Sodium: 141 mEq/L (ref 135–145)
Total Bilirubin: 0.8 mg/dL (ref 0.2–1.2)
Total Protein: 7.4 g/dL (ref 6.0–8.3)

## 2019-12-14 LAB — CBC WITH DIFFERENTIAL/PLATELET
Basophils Absolute: 0 10*3/uL (ref 0.0–0.1)
Basophils Relative: 0.5 % (ref 0.0–3.0)
Eosinophils Absolute: 0.3 10*3/uL (ref 0.0–0.7)
Eosinophils Relative: 5.3 % — ABNORMAL HIGH (ref 0.0–5.0)
HCT: 49.6 % (ref 39.0–52.0)
Hemoglobin: 16.6 g/dL (ref 13.0–17.0)
Lymphocytes Relative: 17.2 % (ref 12.0–46.0)
Lymphs Abs: 1.1 10*3/uL (ref 0.7–4.0)
MCHC: 33.4 g/dL (ref 30.0–36.0)
MCV: 92.7 fl (ref 78.0–100.0)
Monocytes Absolute: 0.3 10*3/uL (ref 0.1–1.0)
Monocytes Relative: 5.2 % (ref 3.0–12.0)
Neutro Abs: 4.6 10*3/uL (ref 1.4–7.7)
Neutrophils Relative %: 71.8 % (ref 43.0–77.0)
Platelets: 284 10*3/uL (ref 150.0–400.0)
RBC: 5.35 Mil/uL (ref 4.22–5.81)
RDW: 13.5 % (ref 11.5–15.5)
WBC: 6.5 10*3/uL (ref 4.0–10.5)

## 2019-12-14 LAB — MICROALBUMIN / CREATININE URINE RATIO
Creatinine,U: 136 mg/dL
Microalb Creat Ratio: 1.8 mg/g (ref 0.0–30.0)
Microalb, Ur: 2.4 mg/dL — ABNORMAL HIGH (ref 0.0–1.9)

## 2019-12-14 LAB — LIPID PANEL
Cholesterol: 188 mg/dL (ref 0–200)
HDL: 45.7 mg/dL (ref >39.00)
LDL Cholesterol: 107 mg/dL — ABNORMAL HIGH (ref 0–99)
NonHDL: 142.07
Total CHOL/HDL Ratio: 4
Triglycerides: 177 mg/dL — ABNORMAL HIGH (ref 0.0–149.0)
VLDL: 35.4 mg/dL (ref 0.0–40.0)

## 2019-12-14 LAB — TSH: TSH: 1.45 u[IU]/mL (ref 0.35–4.50)

## 2019-12-14 LAB — PSA: PSA: 1.59 ng/mL (ref 0.10–4.00)

## 2019-12-14 MED ORDER — NAPROXEN 375 MG PO TABS: 375.0000 mg | ORAL_TABLET | Freq: Every day | ORAL | 0 refills | Status: DC

## 2019-12-14 MED ORDER — DOXYCYCLINE HYCLATE 100 MG PO TABS
100.0000 mg | ORAL_TABLET | Freq: Two times a day (BID) | ORAL | 0 refills | Status: DC
Start: 2019-12-14 — End: 2020-03-23

## 2019-12-14 NOTE — Assessment & Plan Note (Signed)
Chronic, above goal, with known component of white coat hypertension - anticipate contributing as he's newly establishing at our office today.  Encouraged continue to monitor at home and let me know if consistently >140/90 at home to make med chages. He continues amlodipine 5mg  daily.

## 2019-12-14 NOTE — Assessment & Plan Note (Signed)
Improved control noted today - continue amlodipine 5mg  daily.

## 2019-12-14 NOTE — Assessment & Plan Note (Signed)
Longstanding symptoms over 8 months - ongoing discomfort associated with lower back pain, overall reassuring DRE however. suspect chronic prostatitis. Check UCx, PSA and further labwork today including CBC. Sulfa allergy. Rx naprosyn 375mg  daily for 2 wks as well as doxycycline 100mg  bid for 2 weeks. Discussed possible return to uro, consider PFPT pending results of above.

## 2019-12-14 NOTE — Patient Instructions (Addendum)
Vacuna shingrix hoy (para el herpes zoster). regresar en 2-6 meses para segunda vacuna. Laboratorios hoy incluyendo cultura de [place].  Siga ejercicio regular Sintomas de dolor abdominal y de espalda pueden ser por prostatitis cronica - tome antibiotico doxycyclina por 2 semanas. Tambien tome naprosyn 375mg  diario por 2 semanas.   Prostatitis Prostatitis  La prostatitis es la hinchazn o inflamacin de la prstata. La prstata es una glndula del tamao de una nuez que participa en la produccin de semen. Est ubicada debajo de la vejiga del hombre, frente al recto. Hay cuatro tipos de prostatitis:  Prostatitis crnica no bacteriana. Es el tipo ms frecuente de prostatitis. Puede estar asociada a una infeccin viral o trastorno autoinmunitario.  Prostatitis bacteriana aguda. Es el tipo menos frecuente de prostatitis. Comienza rpidamente y, por lo general, se manifiesta junto con una infeccin de la vejiga, fiebre alta y escalofros. Puede ocurrir a Hotel manager.  Prostatitis bacteriana crnica. Por lo general, este tipo se origina a causa de una prostatitis bacteriana aguda que ocurre repetidamente (es recurrente) o que no se ha tratado Personal assistant. Se puede presentar en hombres de cualquier edad, pero es ms comn en los hombres de [place], [place] prstata ha comenzado a Engineer, site. Los sntomas no son tan graves como los de la prostatitis [place].  Prostatodinia o sndrome de dolor plvico crnico (SDPC). Este tipo tambin se denomina trastorno del suelo plvico. Est asociado a un aumento del tono muscular en la pelvis que rodea a la prstata. Cules son las causas? La prostatitis bacteriana es causada por una infeccin bacteriana. La prostatitis crnica no bacteriana puede ser causada por lo siguiente:  Infecciones de las vas urinarias (IU).  Dao nervioso.  Una respuesta provocada por el sistema inmunitario (respuesta autoinmunitaria).  Qumicos en la orina. Se  desconocen las causas de los otros tipos de prostatitis. Cules son los signos o sntomas? Los sntomas pueden variar segn el tipo de prostatitis. Si sufre prostatitis bacteriana aguda, es posible que tenga lo siguiente:  Sntomas urinarios, por ejemplo: ? Dolor al [place]. ? Sensacin de Scientist, research (physical sciences). ? Necesidad frecuente y sbita de Garment/textile technologist. ? Imposibilidad para comenzar a Garment/textile technologist. ? Un chorro de orina dbil o interrumpido.  Vmitos.  Nuseas.  Cristy Hilts.  Escalofros.  Imposibilidad de vaciar la vejiga completamente.  Dolor en: ? [place] articulaciones. ? La parte inferior de la espalda. ? La parte inferior del abdomen. Si tiene cualquiera de los otros tipos de prostatitis, es posible que tenga lo siguiente:  Sntomas urinarios, por ejemplo: ? Necesidad urgente y repentina de Garment/textile technologist. ? Ganas frecuentes de orinar. ? Dificultad para comenzar a Garment/textile technologist. ? Chorro de orina dbil. ? Goteo al terminar de orinar.  Secrecin por Geologist, engineering. La uretra es un tubo que se abre en el extremo del pene.  Dolor en: ? Los testculos. ? El pene o la punta del pene. ? El recto. ? La zona frente al recto y debajo del escroto (perineo).  Problemas con la funcin sexual.  Eyaculacin dolorosa.  Semen con sangre. Cmo se diagnostica? Esta afeccin se puede diagnosticar en funcin de lo siguiente:  Un examen fsico y mdico.  Sus sntomas.  Un anlisis de orina para detectar bacterias.  Un anlisis en el cual el mdico palpa la prstata con el dedo (examen rectal digital).  Un anlisis de [place] de semen.  Anlisis de [place].  Ecografa.  La extraccin de Truddie Coco de tejido prosttico para examinarla con un microscopio (  biopsia).  Anlisis para verificar cmo el cuerpo maneja la orina (estudios urodinmicos).  Un estudio para Social research officer, government interior de la vejiga o la uretra (cistoscopia). Cmo se trata? El tratamiento de esta afeccin depende del  tipo de prostatitis. El tratamiento puede implicar lo siguiente:  Medicamentos para Best boy o la inflamacin.  Medicamentos para Scientist, research (life sciences).  Fisioterapia.  Terapia con calor.  Tcnicas que lo ayudan a Chartered certified accountant funciones corporales (biorregulacin).  Ejercicios de relajacin.  Antibiticos si la enfermedad es causada por una bacteria.  Baos de agua caliente (baos de asiento). Los baos de asiento ayudan a [employer] msculos del suelo plvico, lo que alivia la presin ejercida en la prstata. Siga estas indicaciones en su casa:   Tome los medicamentos de venta libre y los recetados solamente como se lo haya indicado el mdico.  Si le recetaron un antibitico, tmelo o selo como se lo haya indicado el mdico. No deje de tomar el antibitico aunque comience a sentirse mejor.  Si le recetaron fisioterapia, biorregulacin o ejercicios de relajacin, hgalos segn las indicaciones recibidas.  Tome baos de asiento segn las indicaciones del mdico. Para realizar un bao de asiento, sintese en agua tibia que le cubra la cadera y las nalgas.  Concurra a todas las visitas de control como se lo haya indicado el mdico. Esto es importante. Comunquese con un mdico si:  Los sntomas empeoran.  Tiene fiebre. Solicite ayuda de inmediato si:  Tiene escalofros.  Siente nuseas.  Vomita.  Se siente mareado o como si se fuera a desmayar.  No puede orinar.  Tiene sangre o cogulos de sangre en la orina. Esta informacin no tiene Marine scientist el consejo del mdico. Asegrese de hacerle al mdico cualquier pregunta que tenga. Document Revised: 08/03/2017 Document Reviewed: 10/25/2015 Elsevier Patient Education  2020 Bath Maintenance, Male Adopting a healthy lifestyle and getting preventive care are important in promoting health and wellness. Ask your health care provider about:  The right schedule for you to have regular  tests and exams.  Things you can do on your own to prevent diseases and keep yourself healthy. What should I know about diet, weight, and exercise? Eat a healthy diet   Eat a diet that includes plenty of vegetables, fruits, low-fat dairy products, and lean protein.  Do not eat a lot of foods that are high in solid fats, added sugars, or sodium. Maintain a healthy weight Body mass index (BMI) is a measurement that can be used to identify possible weight problems. It estimates body fat based on height and weight. Your health care provider can help determine your BMI and help you achieve or maintain a healthy weight. Get regular exercise Get regular exercise. This is one of the most important things you can do for your health. Most adults should:  Exercise for at least 150 minutes each week. The exercise should increase your heart rate and make you sweat (moderate-intensity exercise).  Do strengthening exercises at least twice a week. This is in addition to the moderate-intensity exercise.  Spend less time sitting. Even light physical activity can be beneficial. Watch cholesterol and blood lipids Have your blood tested for lipids and cholesterol at 61 years of age, then have this test every 5 years. You may need to have your cholesterol levels checked more often if:  Your lipid or cholesterol levels are high.  You are older than 61 years of age.  You are at high risk  for heart disease. What should I know about cancer screening? Many types of cancers can be detected early and may often be prevented. Depending on your health history and family history, you may need to have cancer screening at various ages. This may include screening for:  Colorectal cancer.  Prostate cancer.  Skin cancer.  Lung cancer. What should I know about heart disease, diabetes, and high blood pressure? Blood pressure and heart disease  High blood pressure causes heart disease and increases the risk of  stroke. This is more likely to develop in people who have high blood pressure readings, are of African descent, or are overweight.  Talk with your health care provider about your target blood pressure readings.  Have your blood pressure checked: ? Every 3-5 years if you are 18-15 years of age. ? Every year if you are 51 years old or older.  If you are between the ages of 42 and 71 and are a current or former smoker, ask your health care provider if you should have a one-time screening for abdominal aortic aneurysm (AAA). Diabetes Have regular diabetes screenings. This checks your fasting blood sugar level. Have the screening done:  Once every three years after age 13 if you are at a normal weight and have a low risk for diabetes.  More often and at a younger age if you are overweight or have a high risk for diabetes. What should I know about preventing infection? Hepatitis B If you have a higher risk for hepatitis B, you should be screened for this virus. Talk with your health care provider to find out if you are at risk for hepatitis B infection. Hepatitis C Blood testing is recommended for:  Everyone born from [birth year] through 1967.  Anyone with known risk factors for hepatitis C. Sexually transmitted infections (STIs)  You should be screened each year for STIs, including gonorrhea and chlamydia, if: ? You are sexually active and are younger than 61 years of age. ? You are older than 61 years of age and your health care provider tells you that you are at risk for this type of infection. ? Your sexual activity has changed since you were last screened, and you are at increased risk for chlamydia or gonorrhea. Ask your health care provider if you are at risk.  Ask your health care provider about whether you are at high risk for HIV. Your health care provider may recommend a prescription medicine to help prevent HIV infection. If you choose to take medicine to prevent HIV, you should first  get tested for HIV. You should then be tested every 3 months for as long as you are taking the medicine. Follow these instructions at home: Lifestyle  Do not use any products that contain nicotine or tobacco, such as cigarettes, e-cigarettes, and chewing tobacco. If you need help quitting, ask your health care provider.  Do not use street drugs.  Do not share needles.  Ask your health care provider for help if you need support or information about quitting drugs. Alcohol use  Do not drink alcohol if your health care provider tells you not to drink.  If you drink alcohol: ? Limit how much you have to 0-2 drinks a day. ? Be aware of how much alcohol is in your drink. In the [place] one drink equals one 12 oz bottle of beer (355 mL), one 5 oz glass of wine (148 mL), or one 1 oz glass of hard liquor (44 mL). General instructions  Schedule regular health, dental, and eye exams.  Stay current with your vaccines.  Tell your health care provider if: ? You often feel depressed. ? You have ever been abused or do not feel safe at home. Summary  Adopting a healthy lifestyle and getting preventive care are important in promoting health and wellness.  Follow your health care provider's instructions about healthy diet, exercising, and getting tested or screened for diseases.  Follow your health care provider's instructions on monitoring your cholesterol and blood pressure. This information is not intended to replace advice given to you by your health care provider. Make sure you discuss any questions you have with your health care provider. Document Revised: 04/14/2018 Document Reviewed: 04/14/2018 Elsevier Patient Education  2020 [employer].

## 2019-12-14 NOTE — Assessment & Plan Note (Addendum)
H/o recurrent kidney stones, latest episode 04/2019 s/p urological intervention 07/2019 with resolution of symptoms. Urology records reviewed. Stone analysis returned calcium oxalate (65%) and carbonate apatite (35%). Encourage good hydration status, will continue to watch for now. Update UA today r/o ongoing hematuria.

## 2019-12-14 NOTE — Progress Notes (Signed)
This visit was conducted in person.  BP 126/78 (BP Location: Left Arm, Patient Position: Sitting, Cuff Size: Normal)   Pulse 72   Temp 97.6 F (36.4 C) (Temporal)   Ht 5' 5.5" (1.664 m)   Wt 160 lb 4 oz (72.7 kg)   SpO2 97%   BMI 26.26 kg/m    CC: CPE Subjective:    Patient ID: John Wall, male    DOB: 1960/10/21, 59 y.o.   MRN: [id number]  HPI: John Wall is a 59 y.o. male presenting on 12/14/2019 for Annual Exam   Notes some ongoing suprapubic discomfort more noticeable at night time, noticeable when he bends over, worse with sex. Also notices some lower back pain. Symptoms ongoing for the past 8 months. Ibuprofen 400mg  can cause GI upset. No dysuria but notes increased urgency and nocturia again especially after sexual relations. He has had prior normal prostate biopsy ~10 yrs ago ([facility]).   Preventative: Colon cancer screen - colonoscopy 2014 WNL Deatra Ina) Prostate cancer screen - checked yearly, nocturia x2-3, urgency worse at night Lung cancer screening - not eligible Flu shot - declines - had back reaction to this 1996 and 2000  Tdap 07/2014  Pneumonia shot - not due [place] 07/2019, 08/2019 Shingrix - first dose today  Advanced directive discussion -  Seat belt use discussed  Sunscreen use discussed. No changing moles on skin.  Non smoker Alcohol - a few beers or drinks of liquor a week Dentist - Q6 mo Eye exam - yearly  From [place]  Lives with wife, son (2004)  Occ: Art gallery manager, drafting Financial trader) Activity: running 3 mi 2-3 times a day, bike riding Diet: good water, fruits/vegetables daily  Caffeine: 2 cups of coffee/day     Relevant past medical, surgical, family and social history reviewed and updated as indicated. Interim medical history since our last visit reviewed. Allergies and medications reviewed and updated. Outpatient Medications Prior to Visit  Medication Sig Dispense Refill  . acetaminophen (TYLENOL) 325  MG tablet Take 650 mg by mouth every 6 (six) hours as needed.    Marland Kitchen amLODipine (NORVASC) 5 MG tablet Take 1 tablet (5 mg total) by mouth daily. 90 tablet 3  . Multiple Vitamin (MULTIVITAMIN) tablet Take 1 tablet by mouth daily.     No facility-administered medications prior to visit.     Per HPI unless specifically indicated in ROS section below Review of Systems  Constitutional: Negative for activity change, appetite change, chills, fatigue, fever and unexpected weight change.  HENT: Negative for hearing loss.   Eyes: Negative for visual disturbance.  Respiratory: Negative for cough, chest tightness, shortness of breath and wheezing.   Cardiovascular: Negative for chest pain, palpitations and leg swelling.  Gastrointestinal: Positive for abdominal pain. Negative for abdominal distention, blood in stool, constipation, diarrhea, nausea and vomiting.  Genitourinary: Negative for difficulty urinating and hematuria.  Musculoskeletal: Negative for arthralgias, myalgias and neck pain.  Skin: Negative for rash.  Neurological: Negative for dizziness, seizures, syncope and headaches.  Hematological: Negative for adenopathy. Bruises/bleeds easily.  Psychiatric/Behavioral: Negative for dysphoric mood. The patient is not nervous/anxious.    Objective:  BP 126/78 (BP Location: Left Arm, Patient Position: Sitting, Cuff Size: Normal)   Pulse 72   Temp 97.6 F (36.4 C) (Temporal)   Ht 5' 5.5" (1.664 m)   Wt 160 lb 4 oz (72.7 kg)   SpO2 97%   BMI 26.26 kg/m   Wt Readings from Last 3 Encounters:  12/14/19 160 lb 4 oz (72.7 kg)  12/06/19 159 lb 9 oz (72.4 kg)  07/04/19 163 lb 9.3 oz (74.2 kg)      Physical Exam Vitals and nursing note reviewed.  Constitutional:      General: He is not in acute distress.    Appearance: Normal appearance. He is well-developed. He is not ill-appearing.  HENT:     Head: Normocephalic and atraumatic.     Right Ear: Hearing, tympanic membrane, ear canal and  external ear normal.     Left Ear: Hearing, tympanic membrane, ear canal and external ear normal.  Eyes:     General: No scleral icterus.    Extraocular Movements: Extraocular movements intact.     Conjunctiva/sclera: Conjunctivae normal.     Pupils: Pupils are equal, round, and reactive to light.  Neck:     Thyroid: No thyroid mass, thyromegaly or thyroid tenderness.  Cardiovascular:     Rate and Rhythm: Normal rate and regular rhythm.     Pulses: Normal pulses.          Radial pulses are 2+ on the right side and 2+ on the left side.     Heart sounds: Normal heart sounds. No murmur heard.   Pulmonary:     Effort: Pulmonary effort is normal. No respiratory distress.     Breath sounds: Normal breath sounds. No wheezing, rhonchi or rales.  Abdominal:     General: Abdomen is flat. Bowel sounds are normal. There is no distension.     Palpations: Abdomen is soft. There is no mass.     Tenderness: There is no abdominal tenderness. There is no guarding or rebound.     Hernia: No hernia is present.  Genitourinary:    Pubic Area: No rash.      Penis: Normal and uncircumcised.      Testes: Normal.        Right: Mass, tenderness or swelling not present.        Left: Mass, tenderness or swelling not present.     Prostate: Normal. Not enlarged (20gm), not tender and no nodules present.     Rectum: Normal. No mass, tenderness, anal fissure, external hemorrhoid or internal hemorrhoid. Normal anal tone.  Musculoskeletal:        General: Normal range of motion.     Cervical back: Normal range of motion and neck supple.     Right lower leg: No edema.     Left lower leg: No edema.  Lymphadenopathy:     Cervical: No cervical adenopathy.  Skin:    General: Skin is warm and dry.     Findings: No rash.  Neurological:     General: No focal deficit present.     Mental Status: He is alert and oriented to person, place, and time.     Comments: CN grossly intact, station and gait intact   Psychiatric:        Mood and Affect: Mood normal.        Behavior: Behavior normal.        Thought Content: Thought content normal.        Judgment: Judgment normal.       Results for orders placed or performed in visit on 12/06/19  POCT Urinalysis Dipstick (Automated)  Result Value Ref Range   Color, UA yellow    Clarity, UA clear    Glucose, UA Negative Negative   Bilirubin, UA negative    Ketones, UA negative    Spec  Grav, UA 1.015 1.010 - 1.025   Blood, UA negative    pH, UA 7.5 5.0 - 8.0   Protein, UA Negative Negative   Urobilinogen, UA 0.2 0.2 or 1.0 E.U./dL   Nitrite, UA negative    Leukocytes, UA Negative Negative   Lab Results  Component Value Date   PSA1 1.7 12/08/2018   PSA1 0.8 09/22/2017   PSA1 0.7 08/27/2016   PSA 0.54 07/07/2015   PSA 0.64 07/08/2014   PSA 0.64 08/27/2013   Assessment & Plan:  This visit occurred during the SARS-CoV-2 public health emergency.  Safety protocols were in place, including screening questions prior to the visit, additional usage of staff PPE, and extensive cleaning of exam room while observing appropriate contact time as indicated for disinfecting solutions.   Problem List Items Addressed This Visit    Suprapubic discomfort    Longstanding symptoms over 8 months - ongoing discomfort associated with lower back pain, overall reassuring DRE however. suspect chronic prostatitis. Check UCx, PSA and further labwork today including CBC. Sulfa allergy. Rx naprosyn 375mg  daily for 2 wks as well as doxycycline 100mg  bid for 2 weeks. Discussed possible return to uro, consider PFPT pending results of above.       Relevant Orders   Urine Culture   CBC with Differential/Platelet   HTN (hypertension)    Improved control noted today - continue amlodipine 5mg  daily.       Encounter for general adult medical examination with abnormal findings - Primary    Preventative protocols reviewed and updated unless pt declined. Discussed healthy  diet and lifestyle.        Other Visit Diagnoses    Special screening for malignant neoplasm of prostate       Diabetes mellitus screening       Lipid screening       Need for shingles vaccine       Relevant Orders   Varicella-zoster vaccine IM (Completed)       Meds ordered this encounter  Medications  . doxycycline (VIBRA-TABS) 100 MG tablet    Sig: Take 1 tablet (100 mg total) by mouth 2 (two) times daily.    Dispense:  28 tablet    Refill:  0  . naproxen (NAPROSYN) 375 MG tablet    Sig: Take 1 tablet (375 mg total) by mouth daily. For 2 weeks then as needed    Dispense:  30 tablet    Refill:  0   Orders Placed This Encounter  Procedures  . Urine Culture  . Varicella-zoster vaccine IM  . CBC with Differential/Platelet    Patient instructions: Vacuna contra shingrix hoy. regresar en 2-6 meses para segunda vacuna. Laboratorios hoy incluyendo cultura de [place].  Siga ejercicio regular Sintomas de dolor abdominal y de espalda pueden ser por prostatitis cronica - tome antibiotico doxycyclina por 2 semanas. Tambien tome naprosyn 375mg  diario por 2 semanas.   Follow up plan: Return in about 1 year (around 12/13/2020), or if symptoms worsen or fail to improve, for annual exam, prior fasting for blood work.  Ria Bush, MD

## 2019-12-14 NOTE — Assessment & Plan Note (Signed)
Preventative protocols reviewed and updated unless pt declined. Discussed healthy diet and lifestyle.  

## 2019-12-15 LAB — URINE CULTURE
MICRO NUMBER:: 10814086
SPECIMEN QUALITY:: ADEQUATE

## 2020-01-07 ENCOUNTER — Other Ambulatory Visit: Payer: Self-pay | Admitting: Family Medicine

## 2020-01-10 NOTE — Telephone Encounter (Signed)
Naproxen Last filled:  12/14/19, #30 Last OV:  12/14/19, CPE Next OV:  none

## 2020-02-12 ENCOUNTER — Other Ambulatory Visit: Payer: Self-pay | Admitting: Family Medicine

## 2020-02-13 NOTE — Telephone Encounter (Signed)
Naproxen Last filled:  01/11/20, #30 Last OV:  12/14/19, CPE  Next OV:  none

## 2020-03-15 ENCOUNTER — Telehealth: Payer: Self-pay

## 2020-03-15 ENCOUNTER — Other Ambulatory Visit: Payer: Self-pay

## 2020-03-15 ENCOUNTER — Ambulatory Visit (INDEPENDENT_AMBULATORY_CARE_PROVIDER_SITE_OTHER): Payer: 59

## 2020-03-15 DIAGNOSIS — Z23 Encounter for immunization: Secondary | ICD-10-CM

## 2020-03-15 NOTE — Telephone Encounter (Signed)
Patient wants Dr. Darnell Level to know that his joint paint still exist. Patient says he no longer has abdominal pain. Says when he lift and run he feels pain. Wants to speak with Dr. Darnell Level asap

## 2020-03-16 NOTE — Telephone Encounter (Signed)
Called patient - presumed prostatitis treated with NSAID and doxy course with improvement.   Now notes ongoing joint, tendon pain. Shoulders, elbows, R knee, R achilles, R hand pain. No redness, swelling, warmth of joints. No fever.  He is a chronic runner - no longer able to run due to this.  Needs to take OTC naprosyn regularly.  He did have bug bites to legs 2 months ago.   rec schedule OV for further evaluation and likely labs. He requests appt next Friday.

## 2020-03-23 ENCOUNTER — Ambulatory Visit: Payer: 59 | Admitting: Family Medicine

## 2020-03-23 ENCOUNTER — Other Ambulatory Visit: Payer: Self-pay

## 2020-03-23 ENCOUNTER — Encounter: Payer: Self-pay | Admitting: Family Medicine

## 2020-03-23 VITALS — BP 134/78 | HR 73 | Temp 97.8°F | Ht 65.5 in | Wt 166.4 lb

## 2020-03-23 DIAGNOSIS — M2550 Pain in unspecified joint: Secondary | ICD-10-CM | POA: Diagnosis not present

## 2020-03-23 DIAGNOSIS — M4328 Fusion of spine, sacral and sacrococcygeal region: Secondary | ICD-10-CM | POA: Diagnosis not present

## 2020-03-23 MED ORDER — NAPROXEN 375 MG PO TABS
375.0000 mg | ORAL_TABLET | Freq: Two times a day (BID) | ORAL | 3 refills | Status: DC
Start: 2020-03-23 — End: 2020-11-20

## 2020-03-23 NOTE — Patient Instructions (Addendum)
Laboratorios hoy para evaluar artritis inflamatoria. Puede seguir naproxen para dolor de articulaciones.  Dependiendo de los resultados de pronto lo mandamos para evaluacion con Cytogeneticist.

## 2020-03-23 NOTE — Progress Notes (Signed)
Patient ID: John Wall, male    DOB: Dec 17, 1960, 59 y.o.   MRN: [id number]  This visit was conducted in person.  BP 134/78 (BP Location: Left Arm, Patient Position: Sitting, Cuff Size: Normal)   Pulse 73   Temp 97.8 F (36.6 C) (Temporal)   Ht 5' 5.5" (1.664 m)   Wt 166 lb 6 oz (75.5 kg)   SpO2 98%   BMI 27.27 kg/m    CC: discuss joint pains  Subjective:   HPI: John Wall is a 59 y.o. male presenting on 03/23/2020 for Joint Pain (C/o allover joint pain.  Started about 2 mos ago.  Tried naproxen, helpful.)   2 mo h/o joint pains predominantly L>R knee, L medial elbow pain, R achilles tendon. Also has pain at R shoulder - points to lateral upper arm. Chronic lower back pain without radiation.  Notes R hand stiffness/swelling at PIP joints for the past 4 years. No redness or warmth.  Chronic L leg length discrepancy (shorter).  Notes enlarging R 5th digit nodule at PIP.  Notes increased back pain this past year.  No warmth, swelling of joints.  He finds naprosyn (previously prescribed for chronic prostatitis) has helped.   He largely avoids red meat (wife has stopped all red meats). He doesn't note worsening when he eats occasional hamburger.   No fevers/chills, rash, abd pain, nausea.  Weight gain noted.  Did have 2 bug bites inner inguinal region several months ago, unknown insect. He is active outdoors and walks through forest.  Chronic runner - having trouble running due to joint pains.  Mother has arthritis.   CT abd/pelvis without contrast 05/2019: Left SI joint partial ankylosis. Polyarticular degenerative change.  On amlodipine for the past 10 yrs.      Relevant past medical, surgical, family and social history reviewed and updated as indicated. Interim medical history since our last visit reviewed. Allergies and medications reviewed and updated. Outpatient Medications Prior to Visit  Medication Sig Dispense Refill  . amLODipine (NORVASC) 5 MG tablet Take 1  tablet (5 mg total) by mouth daily. 90 tablet 3  . Multiple Vitamin (MULTIVITAMIN) tablet Take 1 tablet by mouth daily.    . naproxen (NAPROSYN) 375 MG tablet TAKE 1 TABLET (375 MG TOTAL) BY MOUTH DAILY. FOR 2 WEEKS THEN AS NEEDED 30 tablet 0  . acetaminophen (TYLENOL) 325 MG tablet Take 650 mg by mouth every 6 (six) hours as needed.    . doxycycline (VIBRA-TABS) 100 MG tablet Take 1 tablet (100 mg total) by mouth 2 (two) times daily. 28 tablet 0   No facility-administered medications prior to visit.     Per HPI unless specifically indicated in ROS section below Review of Systems Objective:  BP 134/78 (BP Location: Left Arm, Patient Position: Sitting, Cuff Size: Normal)   Pulse 73   Temp 97.8 F (36.6 C) (Temporal)   Ht 5' 5.5" (1.664 m)   Wt 166 lb 6 oz (75.5 kg)   SpO2 98%   BMI 27.27 kg/m   Wt Readings from Last 3 Encounters:  03/23/20 166 lb 6 oz (75.5 kg)  12/14/19 160 lb 4 oz (72.7 kg)  12/06/19 159 lb 9 oz (72.4 kg)      Physical Exam Vitals and nursing note reviewed.  Constitutional:      Appearance: Normal appearance. He is not ill-appearing.  Musculoskeletal:        General: Normal range of motion.     Right lower leg: No edema.  Left lower leg: No edema.     Comments:  FROM bilateral knees with crepitus, point tender Small mobile rubbery mass to R 5th PIP Discomfort to palpation R SIJ Point tender at L medial epicondyle Tender to palpation of R medial achilles tendon at insertion into calcaneus No active synovitis  Skin:    General: Skin is warm and dry.     Findings: No rash.  Neurological:     Mental Status: He is alert.  Psychiatric:        Mood and Affect: Mood normal.        Behavior: Behavior normal.       Results for orders placed or performed in visit on 03/23/20  Sedimentation rate  Result Value Ref Range   Sed Rate 2 0 - 20 mm/h  High sensitivity CRP  Result Value Ref Range   hs-CRP 2.4 mg/L  ANA  Result Value Ref Range   Anti  Nuclear Antibody (ANA) NEGATIVE NEGATIVE  Uric acid  Result Value Ref Range   Uric Acid, Serum 3.8 (L) 4.0 - 8.0 mg/dL   Assessment & Plan:  This visit occurred during the SARS-CoV-2 public health emergency.  Safety protocols were in place, including screening questions prior to the visit, additional usage of staff PPE, and extensive cleaning of exam room while observing appropriate contact time as indicated for disinfecting solutions.   Problem List Items Addressed This Visit    Polyarthralgia - Primary    Months of joint pains throughout body, today with evidence of possible achilles enthesitis and nodule to R 5th PIP, prior imaging 05/2019 showing partial fusion of L SI joint. Patient describes inflammatory arthritis pain - am gelling/stiffness that improves as day progresses. ?spondyloarthropathy - check HLA B27 as well as other rheum labs including CRP, ESR. Consider rheum eval.       Relevant Orders   Sedimentation rate (Completed)   High sensitivity CRP (Completed)   ANA (Completed)   HLA-B27 antigen   RNP Antibody   Uric acid (Completed)   Lyme Ab/Western Blot Reflex   Ankylosis of sacroiliac joint    Partial fusion of L SIJ by latest imaging study (CT abd/pelvis 05/2019).  Consider spondyloarthopathies - pending lab results.           Meds ordered this encounter  Medications  . naproxen (NAPROSYN) 375 MG tablet    Sig: Take 1 tablet (375 mg total) by mouth 2 (two) times daily with a meal. As needed    Dispense:  40 tablet    Refill:  3   Orders Placed This Encounter  Procedures  . Sedimentation rate  . High sensitivity CRP  . ANA  . HLA-B27 antigen  . RNP Antibody  . Uric acid  . Lyme Ab/Western Blot Reflex    Patient Instructions  Laboratorios hoy para evaluar artritis inflamatoria. Puede seguir naproxen para dolor de articulaciones.  Dependiendo de los resultados de pronto lo mandamos para evaluacion con Cytogeneticist.   Follow up plan: Return if symptoms  worsen or fail to improve.  Ria Bush, MD

## 2020-03-26 DIAGNOSIS — M2550 Pain in unspecified joint: Secondary | ICD-10-CM | POA: Insufficient documentation

## 2020-03-26 DIAGNOSIS — M4328 Fusion of spine, sacral and sacrococcygeal region: Secondary | ICD-10-CM | POA: Insufficient documentation

## 2020-03-26 LAB — URIC ACID: Uric Acid, Serum: 3.8 mg/dL — ABNORMAL LOW (ref 4.0–8.0)

## 2020-03-26 LAB — HIGH SENSITIVITY CRP: hs-CRP: 2.4 mg/L

## 2020-03-26 LAB — ANA: Anti Nuclear Antibody (ANA): NEGATIVE

## 2020-03-26 LAB — RNP ANTIBODY: Ribonucleic Protein(ENA) Antibody, IgG: <1 AI

## 2020-03-26 LAB — HLA-B27 ANTIGEN: HLA-B27 Antigen: NEGATIVE

## 2020-03-26 LAB — SEDIMENTATION RATE: Sed Rate: 2 mm/h (ref 0–20)

## 2020-03-26 NOTE — Assessment & Plan Note (Signed)
Partial fusion of L SIJ by latest imaging study (CT abd/pelvis 05/2019).  Consider spondyloarthopathies - pending lab results.

## 2020-03-26 NOTE — Assessment & Plan Note (Signed)
Months of joint pains throughout body, today with evidence of possible achilles enthesitis and nodule to R 5th PIP, prior imaging 05/2019 showing partial fusion of L SI joint. Patient describes inflammatory arthritis pain - am gelling/stiffness that improves as day progresses. ?spondyloarthropathy - check HLA B27 as well as other rheum labs including CRP, ESR. Consider rheum eval.

## 2020-03-27 LAB — LYME AB/WESTERN BLOT REFLEX
LYME DISEASE AB, QUANT, IGM: <0.8 index (ref 0.00–0.79)
Lyme IgG/IgM Ab: <0.91 {ISR} (ref 0.00–0.90)

## 2020-11-20 ENCOUNTER — Encounter: Payer: Self-pay | Admitting: Family Medicine

## 2020-11-20 ENCOUNTER — Other Ambulatory Visit: Payer: Self-pay

## 2020-11-20 ENCOUNTER — Ambulatory Visit: Payer: 59 | Admitting: Family Medicine

## 2020-11-20 DIAGNOSIS — G8929 Other chronic pain: Secondary | ICD-10-CM

## 2020-11-20 DIAGNOSIS — M25562 Pain in left knee: Secondary | ICD-10-CM | POA: Diagnosis not present

## 2020-11-20 DIAGNOSIS — S83289A Other tear of lateral meniscus, current injury, unspecified knee, initial encounter: Secondary | ICD-10-CM | POA: Insufficient documentation

## 2020-11-20 MED ORDER — CELECOXIB 100 MG PO CAPS: 100.0000 mg | ORAL_CAPSULE | Freq: Every day | ORAL | 0 refills | Status: DC

## 2020-11-20 NOTE — Assessment & Plan Note (Addendum)
Return of L lateral knee pain consistent with recurrence of IT band syndrome. Previously treated effectively by ortho with injection into tender area. States topical diclofenac has not been effective. Intolerance to oral naprosyn. Will trial celebrex 100mg  daily for pain/inflammation, discussed could try turmeric 500mg  two to three times a day as well. He also continues home exercises provided by Dr Mardelle Matte. If no better, recommend he return to see Dr Lorelei Pont sports medicine for further evaluation of presumed IT band syndrome.  Discussed PPI or pepcid use if he ever needs to use NSAID in future.

## 2020-11-20 NOTE — Patient Instructions (Addendum)
Puede tratar celebrex 100mg  una vez al dia.  Puede usar voltaren gel para las manos.  Si no mejora, haga cita con Dr Lorelei Pont doctor de medicina deportiva para evaluacion.

## 2020-11-20 NOTE — Progress Notes (Signed)
  Patient ID: John Wall, male    DOB: 08/17/1960, 59 y.o.   MRN: [id number]  This visit was conducted in person.  BP 124/80   Pulse 86   Temp 98.1 F (36.7 C) (Temporal)   Ht 5' 5.5" (1.664 m)   Wt 160 lb 11.2 oz (72.9 kg)   SpO2 97%   BMI 26.34 kg/m    CC: knee pain Subjective:   HPI: John Wall is a 59 y.o. male presenting on 11/20/2020 for Acute Visit (C/o having LT knee pain x 1 year.  He has tried taking naproxen but it has been causing upset stomach. )   Last seen 03/2020, at that time labs overall reassuring (uric acid, ANA, ESR/CRP, HLAB27, lyme disease testing and anti-RNP). We discussed rheumatology referral but he declined referral at that time. New tender nodules to left 4th digit PIP and R 5th digit PIP. R shoulder, L elbow, R achilles tendon pain have resolved. Continues avoiding meat and pork without significant benefit.   Saw ortho Dr Landau 06/2020 - dx IT band syndrome treated with steroid injection with benefit at that time.   1 mo h/o recurrent L knee pain describes anterior knee pain into lateral and posterior knee which can limit flexion. This feels similar to prior IT band syndrome.   Had been taking naprosyn however it was upsetting to stomach - he has stopped this.  Tried voltaren gel to knee without any benefit.  Chronic runner.  Father age 72yo deceased with ruptured aorta.   CT abd/pelvis without contrast 05/2019: Left SI joint partial ankylosis, polyarticular degenerative changes.     Relevant past medical, surgical, family and social history reviewed and updated as indicated. Interim medical history since our last visit reviewed. Allergies and medications reviewed and updated. Outpatient Medications Prior to Visit  Medication Sig Dispense Refill   amLODipine (NORVASC) 5 MG tablet Take 1 tablet (5 mg total) by mouth daily. 90 tablet 3   Multiple Vitamin (MULTIVITAMIN) tablet Take 1 tablet by mouth daily.     naproxen (NAPROSYN) 375 MG tablet  Take 1 tablet (375 mg total) by mouth 2 (two) times daily with a meal. As needed (Patient not taking: Reported on 11/20/2020) 40 tablet 3   No facility-administered medications prior to visit.     Per HPI unless specifically indicated in ROS section below Review of Systems  Objective:  BP 124/80   Pulse 86   Temp 98.1 F (36.7 C) (Temporal)   Ht 5' 5.5" (1.664 m)   Wt 160 lb 11.2 oz (72.9 kg)   SpO2 97%   BMI 26.34 kg/m   Wt Readings from Last 3 Encounters:  11/20/20 160 lb 11.2 oz (72.9 kg)  03/23/20 166 lb 6 oz (75.5 kg)  12/14/19 160 lb 4 oz (72.7 kg)      Physical Exam Vitals and nursing note reviewed.  Constitutional:      Appearance: Normal appearance. He is not ill-appearing.  Musculoskeletal:        General: Tenderness present. No swelling. Normal range of motion.     Right lower leg: No edema.     Left lower leg: No edema.     Comments:  Bilateral knee exam: No deformity on inspection. No pain with palpation of knee landmarks. No effusion/swelling noted. FROM in flex/extension without crepitus. No popliteal fullness. Neg drawer test. Neg mcmurray test. No pain with valgus/varus stress. No PFgrind. No abnormal patellar mobility.  Tender to palpation L lateral superior knee   at lateral femoral epicondyle  Skin:    General: Skin is warm and dry.     Findings: No rash.  Neurological:     Mental Status: He is alert.      Results for orders placed or performed in visit on 03/23/20  Sedimentation rate  Result Value Ref Range   Sed Rate 2 0 - 20 mm/h  High sensitivity CRP  Result Value Ref Range   hs-CRP 2.4 mg/L  ANA  Result Value Ref Range   Anti Nuclear Antibody (ANA) NEGATIVE NEGATIVE  HLA-B27 antigen  Result Value Ref Range   HLA-B27 Antigen NEGATIVE NEGATIVE  RNP Antibody  Result Value Ref Range   Ribonucleic Protein(ENA) Antibody, IgG <1.0 NEG <1.0 NEG AI  Uric acid  Result Value Ref Range   Uric Acid, Serum 3.8 (L) 4.0 - 8.0 mg/dL  Lyme  Ab/Western Blot Reflex  Result Value Ref Range   Lyme IgG/IgM Ab <0.91 0.00 - 0.90 ISR   LYME DISEASE AB, QUANT, IGM <0.80 0.00 - 0.79 index    Assessment & Plan:  This visit occurred during the SARS-CoV-2 public health emergency.  Safety protocols were in place, including screening questions prior to the visit, additional usage of staff PPE, and extensive cleaning of exam room while observing appropriate contact time as indicated for disinfecting solutions.   Problem List Items Addressed This Visit     Left knee pain    Return of L lateral knee pain consistent with recurrence of IT band syndrome. Previously treated effectively by ortho with injection into tender area. States topical diclofenac has not been effective. Intolerance to oral naprosyn. Will trial celebrex 161m daily for pain/inflammation, discussed could try turmeric 5030mtwo to three times a day as well. He also continues home exercises provided by Dr LaMardelle MatteIf no better, recommend he return to see Dr CoLorelei Pontports medicine for further evaluation of presumed IT band syndrome.  Discussed PPI or pepcid use if he ever needs to use NSAID in future.          Meds ordered this encounter  Medications   celecoxib (CELEBREX) 100 MG capsule    Sig: Take 1 capsule (100 mg total) by mouth daily.    Dispense:  30 capsule    Refill:  0    No orders of the defined types were placed in this encounter.   Patient Instructions  Puede tratar celebrex 10017mna vez al dia.  Puede usar voltaren gel para las manos.  Si no mejora, haga cita con Dr CopLorelei Pontctor de medicina deportiva para evaluacion.   Follow up plan: Return if symptoms worsen or fail to improve.  JavRia BushD

## 2020-11-30 ENCOUNTER — Other Ambulatory Visit: Payer: Self-pay | Admitting: Family Medicine

## 2020-12-13 ENCOUNTER — Other Ambulatory Visit: Payer: Self-pay

## 2020-12-13 ENCOUNTER — Encounter: Payer: Self-pay | Admitting: Family Medicine

## 2020-12-13 ENCOUNTER — Ambulatory Visit: Payer: 59 | Admitting: Family Medicine

## 2020-12-13 VITALS — BP 144/76 | HR 72 | Temp 98.4°F | Ht 65.5 in | Wt 165.5 lb

## 2020-12-13 DIAGNOSIS — M25462 Effusion, left knee: Secondary | ICD-10-CM

## 2020-12-13 DIAGNOSIS — M2550 Pain in unspecified joint: Secondary | ICD-10-CM

## 2020-12-13 DIAGNOSIS — M25562 Pain in left knee: Secondary | ICD-10-CM | POA: Diagnosis not present

## 2020-12-13 DIAGNOSIS — M2540 Effusion, unspecified joint: Secondary | ICD-10-CM

## 2020-12-13 NOTE — Progress Notes (Signed)
Eola Waldrep T. Bernece Gall, MD, [place] at [facility] [place], [address]  Phone: [phone]  FAX: [phone]  Davison Ohms - 60 y.o. male  MRN [id number]  Date of Birth: Jun 26, 1960  Date: 12/13/2020  PCP: Ria Bush, MD  Referral: Ria Bush, MD  Chief Complaint  Patient presents with   Knee Pain    Left    This visit occurred during the SARS-CoV-2 public health emergency.  Safety protocols were in place, including screening questions prior to the visit, additional usage of staff PPE, and extensive cleaning of exam room while observing appropriate contact time as indicated for disinfecting solutions.   Subjective:   Traycen Goyer is a 60 y.o. very pleasant male patient with Body mass index is 27.12 kg/m. who presents with the following:  L knee pain > 1 year, tried naprosyn and celebrex.  He previously saw my partner Dr. Danise Mina, and he subsequently also saw Dr. Mardelle Matte from orthopedic surgery.  At that time, he did have an intra-articular knee injection, and this provided him some relief for about 3 months.  November 2021: This included a normal sed rate, CRP, ANA, uric acid, HLA-B27.  He also had a negative Lyme antibody test.  06/2020 saw Carter Kitten and got a steroid injection in knee.  ITBS vs. Internal derangement.  Concern for potential meniscal pathology.  I do not have films for my independent review, but on review of [facility] notes it was noted that he had no significant degenerative changes on his plain film.  He reports being stiff in the morning for about 15 minutes. He also has developed some swelling and cystic changes in multiple joints of the fingers.  QUEST  Tap knee  No injection  Rheum if negative  Review of Systems is noted in the HPI, as appropriate   Objective:   BP (!) 144/76   Pulse 72   Temp 98.4 F (36.9 C) (Temporal)   Ht 5' 5.5" (1.664 m)   Wt 165 lb 8  oz (75.1 kg)   SpO2 98%   BMI 27.12 kg/m    Knee: Left Gait: Notably antalgic ROM: Extension to 0 and flexion to 120 Effusion: Large and ballotable Echymosis or edema: none Patellar tendon NT Painful PLICA: neg Patellar grind: negative Medial and lateral patellar facet loading: negative medial and lateral joint lines: Medial and lateral joint space tenderness Mcmurray's pain with some mechanical symptoms. Flexion-pinch positive Varus and valgus stress: stable Lachman: neg Ant and Post drawer: neg Hip abduction, IR, ER: WNL Hip flexion str: 4/5 Hip abd: 5/5 Quad: 4/5 VMO atrophy: He has extensive left-sided quadricep wasting compared to the contralateral side Hamstring concentric and eccentric: 5/5   Multiple finger joints with boggy mucous cysts of the fingers  Radiology: No results found.  Assessment and Plan:     ICD-10-CM   1. Polyarthralgia  M25.50 Rheumatoid factor    Cyclic citrul peptide antibody, IgG    Sedimentation rate    High sensitivity CRP    Synovial Fluid Analysis, Complete    2. Effusion of joint of multiple sites  M25.40 Rheumatoid factor    Cyclic citrul peptide antibody, IgG    Sedimentation rate    High sensitivity CRP    Synovial Fluid Analysis, Complete    3. Knee effusion, left  M25.462 Rheumatoid factor    Cyclic citrul peptide antibody, IgG    Sedimentation rate    High sensitivity CRP  Synovial Fluid Analysis, Complete    4. Acute pain of left knee  M25.562 Rheumatoid factor    Cyclic citrul peptide antibody, IgG    Sedimentation rate    High sensitivity CRP    Synovial Fluid Analysis, Complete     Multiple joint complaints with mucous cyst formation and multiple fingers in the setting of knee pain with a large ballotable effusion.  He has no history of injury, but he has been plagued with pain for greater than a year.  I think the primarily we should assess to make sure that he does not have a rheumatological process, gout,  or CPPD that would be driving his symptoms.  Will assess with additional rheumatological work-up, and then I am going to obtain a diagnostic aspiration of the patient's knee effusion.  We talked about an additional steroid injection, but at this point he would not like to pursue this.  I think that even if all work-up is negative, I do think further consultation with rheumatology would be appropriate in this case prior to considering surgery.  Ultimately, however, this is up to the patient's discretion and choices.  Aspiration/Injection Procedure Note Khaliel Morey 20-Sep-1960 Date of procedure: 12/13/2020  Procedure: Large Joint Aspiration / Injection with synovial fluid aspiration of knee, L Indications: Pain  Procedure Details Patient verbally consented; risks, benefits, and alternatives explained. Patient prepped with Chloraprep. Ethyl chloride for anesthesia. 5 cc of 1% Lidocaine used in wheal then injected Subcutaneous fashion with 22 gauge needle on lateral approach. Under sterilne conditions, 18 gauge needle used via lateral approach to aspirate 40 cc of yellowish synovial fluid.  No additional injection.  He did feel more comfortable and tolerated procedure well.   Orders Placed This Encounter  Procedures   Rheumatoid factor   Cyclic citrul peptide antibody, IgG   Sedimentation rate   High sensitivity CRP   Synovial Fluid Analysis, Complete   TIQ-NTM    Follow-up: No follow-ups on file.  Dragon Medical One speech-to-text software was used for transcription in this dictation.  Possible transcriptional errors can occur using Editor, commissioning.   Signed,  Maud Deed. Lunna Vogelgesang, MD   Outpatient Encounter Medications as of 12/13/2020  Medication Sig   amLODipine (NORVASC) 5 MG tablet TAKE 1 TABLET BY MOUTH EVERY DAY   celecoxib (CELEBREX) 100 MG capsule Take 1 capsule (100 mg total) by mouth daily.   Multiple Vitamin (MULTIVITAMIN) tablet Take 1 tablet by mouth daily.   Turmeric  500 MG TABS Take 1 tablet by mouth in the morning and at bedtime.   No facility-administered encounter medications on file as of 12/13/2020.

## 2020-12-14 LAB — SYNOVIAL FLUID ANALYSIS, COMPLETE
Basophils, %: 0 %
Eosinophils-Synovial: 0 % (ref 0–2)
Lymphocytes-Synovial Fld: 7 % (ref 0–74)
Monocyte/Macrophage: 80 % — ABNORMAL HIGH (ref 0–69)
Neutrophil, Synovial: 12 % (ref 0–24)
Synoviocytes, %: 1 % (ref 0–15)
WBC, Synovial: 345 cells/uL — ABNORMAL HIGH (ref <150)

## 2020-12-14 LAB — SEDIMENTATION RATE: Sed Rate: 2 mm/h (ref 0–20)

## 2020-12-14 LAB — TIQ-NTM

## 2020-12-14 LAB — RHEUMATOID FACTOR: Rheumatoid fact SerPl-aCnc: <14 IU/mL (ref <14)

## 2020-12-14 LAB — HIGH SENSITIVITY CRP: hs-CRP: 1.9 mg/L

## 2020-12-14 LAB — CYCLIC CITRUL PEPTIDE ANTIBODY, IGG: Cyclic Citrullin Peptide Ab: <16 UNITS

## 2021-01-10 ENCOUNTER — Other Ambulatory Visit: Payer: Self-pay

## 2021-01-10 ENCOUNTER — Ambulatory Visit: Payer: BC Managed Care – PPO | Admitting: Family Medicine

## 2021-01-10 VITALS — BP 140/62 | HR 69 | Temp 98.7°F | Ht 65.5 in | Wt 168.0 lb

## 2021-01-10 DIAGNOSIS — M25562 Pain in left knee: Secondary | ICD-10-CM

## 2021-01-10 DIAGNOSIS — M545 Low back pain, unspecified: Secondary | ICD-10-CM

## 2021-01-10 DIAGNOSIS — G8929 Other chronic pain: Secondary | ICD-10-CM

## 2021-01-10 DIAGNOSIS — M25462 Effusion, left knee: Secondary | ICD-10-CM

## 2021-01-10 MED ORDER — DICLOFENAC SODIUM 75 MG PO TBEC: 75.0000 mg | DELAYED_RELEASE_TABLET | Freq: Two times a day (BID) | ORAL | 1 refills | Status: DC

## 2021-01-10 NOTE — Progress Notes (Addendum)
John Sousa T. Milburn Freeney, MD, [name], [address]  Phone: [phone]  FAX: [phone]  John Wall - 60 y.o. male  MRN [id number]  Date of Birth: Sep 26, 1960  Date: 01/10/2021  PCP: Ria Bush, MD  Referral: Ria Bush, MD  Chief Complaint  Patient presents with   Knee Pain    Left   Back Pain    This visit occurred during the SARS-CoV-2 public health emergency.  Safety protocols were in place, including screening questions prior to the visit, additional usage of staff PPE, and extensive cleaning of exam room while observing appropriate contact time as indicated for disinfecting solutions.   Subjective:   John Wall is a 60 y.o. very pleasant male patient with Body mass index is 27.53 kg/m. who presents with the following:  He is here to follow-up on L knee pain.    06/2020 OV with Dr. Mardelle Matte with no degenerative changes on XR and intraarticular injection.  On last OV, we did a broad Rheum work-up that was 100% normal and a normal synovial fluid panel.  He also presents with some back pain. 3 weeks ongoing.  He has not all that worried about this, and it really does not hurt all that bad. No meds.   With his knee pain, his efforts thus far have been recalcitrant including oral NSAIDs, ice, resting, compression, alteration of activities, quadricep strengthening activities, and he continues to do poorly and he thinks his pain might actually be worsening. Pain going down on the stairs.  Only able to walk now.  Using the bike now. Hard up hill.   Review of Systems is noted in the HPI, as appropriate  Patient Active Problem List   Diagnosis Date Noted   Left knee pain 11/20/2020   Polyarthralgia 03/26/2020   Ankylosis of sacroiliac joint 03/26/2020   Nephrolithiasis 12/14/2019   Encounter for general adult medical examination with abnormal findings 12/14/2019   Suprapubic  discomfort 12/14/2019   Abnormal liver enzymes-2017 07/08/2015   HTN (hypertension) 02/03/2013   Murmur 02/03/2013    Past Medical History:  Diagnosis Date   Allergy    Arthritis    hands   Complication of anesthesia    History of kidney stones    Hypertension    Kidney stone 2012   Plantar fasciitis    left foot   PONV (postoperative nausea and vomiting)     Past Surgical History:  Procedure Laterality Date   APPENDECTOMY  1991   COLONOSCOPY  2014   WNL Deatra Ina)   CYSTOSCOPY/URETEROSCOPY/HOLMIUM LASER/STENT PLACEMENT Left 07/04/2019   Procedure: CYSTOSCOPY/RETROGRADE/URETEROSCOPY/HOLMIUM LASER/STENT PLACEMENT;  Surgeon: Raynelle Bring, MD;  Location: [facility];  Service: Urology;  Laterality: Left;   HOLMIUM LASER APPLICATION Left [id number]   Procedure: HOLMIUM LASER APPLICATION;  Surgeon: Raynelle Bring, MD;  Location: [facility];  Service: Urology;  Laterality: Left;   KIDNEY STONE SURGERY  2012   LIPOMA EXCISION  2004   abdominal wall LEFT   RHINOPLASTY  1977   TENDON REPAIR Left 2002   ankle   VARICOCELECTOMY Left 1998    Family History  Problem Relation Age of Onset   Hypertension Mother    Arthritis Mother    High Cholesterol Mother    Aortic aneurysm Father 76       ruptured aorta   Alcohol abuse Father    Colon cancer Neg Hx      Objective:   BP  140/62   Pulse 69   Temp 98.7 F (37.1 C) (Temporal)   Ht 5' 5.5" (1.664 m)   Wt 168 lb (76.2 kg)   SpO2 98%   BMI 27.53 kg/m   GEN: No acute distress; alert,appropriate. PULM: Breathing comfortably in no respiratory distress PSYCH: Normally interactive.   Left knee: Large ballotable effusion.  Minimal pain with loading the medial lateral patellar facets.  Full extension and flexion to 115. Stable to varus and valgus stress, Lachman as well as anterior and posterior drawer testing is normal. Nontender at the tibial tubercle, the tibial plateau, as well as the proximal tibia and fibula.  He does have notable  medial greater than lateral joint line tenderness.  He has very significant tenderness with McMurray's and flexion pinch testing.  He also has pain with bounce home testing.  Nontender at the patellar tendon and the quadricep tendon He does have some comparative quadriceps wasting compared to the right side.  Laboratory and Imaging Data: Reviewed from [facility] files.  Results for orders placed or performed in visit on 12/13/20  Rheumatoid factor  Result Value Ref Range   Rhuematoid fact SerPl-aCnc 99991111 99991111 IU/mL  Cyclic citrul peptide antibody, IgG  Result Value Ref Range   Cyclic Citrullin Peptide Ab <16 UNITS  Sedimentation rate  Result Value Ref Range   Sed Rate 2 0 - 20 mm/h  High sensitivity CRP  Result Value Ref Range   hs-CRP 1.9 mg/L  Synovial Fluid Analysis, Complete  Result Value Ref Range   Site NOT GIVEN    Color, Synovial YELLOW STRAW/YELLOW   Appearance-Synovial CLEAR CLEAR/HAZY   WBC, Synovial 345 (H) <150 cells/uL   Neutrophil, Synovial 12 0 - 24 %   Lymphocytes-Synovial Fld 7 0 - 74 %   Monocyte/Macrophage 80 (H) 0 - 69 %   Eosinophils-Synovial 0 0 - 2 %   Basophils, % 0 0 %   Synoviocytes, % 1 0 - 15 %   Crystals, Fluid  NONE SEEN /HPF  TIQ-NTM  Result Value Ref Range   QUESTION/PROBLEM:     SPECIMEN(S) RECEIVED: Specimen FL unneeded      Assessment and Plan:     ICD-10-CM   1. Chronic pain of left knee  M25.562    G89.29     2. Knee effusion, left  M25.462     3. Acute bilateral low back pain without sciatica  M54.50      Total encounter time: 35 minutes. This includes total time spent on the day of encounter.    9+ month history of left-sided knee pain.  He has had an extensive rheumatological work-up including synovial panel which shows no evidence of rheumatological disease, and he had a normal sed rate, CRP and no crystals on his synovial analysis.  Internal derangement is highly suspected.  Obtain an MRI of the left knee without  contrast to evaluate for loose body or meniscal tear.  Other pathology certainly possible.  Positive flexion pinch, McMurray's, bounce home testing.  Positive joint line tenderness, medial greater than lateral.  Addendum: 01/15/21 10:29 AM  "MRI referral sent to [facility] ([address] [place], [place], [place] [address]). Their Fax is [phone], and their phone number is [phone]. The MRI is for the left knee, without contrast (Code [phone])."  Increase time with the patient's questions regarding his insurance, imaging locations, deductibles, and I tried to  explain that I really have no way of knowing any of these things.  He is going to call his insurance provider and determine what would be the best for him from a monetary standpoint.  He has an existing relationship with Dr. Mardelle Matte from [facility] orthopedics.  Meds ordered this encounter  Medications   diclofenac (VOLTAREN) 75 MG EC tablet    Sig: Take 1 tablet (75 mg total) by mouth 2 (two) times daily.    Dispense:  60 tablet    Refill:  1   Medications Discontinued During This Encounter  Medication Reason   celecoxib (CELEBREX) 100 MG capsule    No orders of the defined types were placed in this encounter.   Follow-up: No follow-ups on file.  Dragon Medical One speech-to-text software was used for transcription in this dictation.  Possible transcriptional errors can occur using Editor, commissioning.   Signed,  Maud Deed. Larnie Heart, MD   Outpatient Encounter Medications as of 01/10/2021  Medication Sig   amLODipine (NORVASC) 5 MG tablet TAKE 1 TABLET BY MOUTH EVERY DAY   diclofenac (VOLTAREN) 75 MG EC tablet Take 1 tablet (75 mg total) by mouth 2 (two) times daily.   Multiple Vitamin (MULTIVITAMIN) tablet Take 1 tablet by mouth daily.   Turmeric 500 MG TABS Take 1 tablet by mouth in the morning and at bedtime.   [DISCONTINUED] celecoxib (CELEBREX) 100 MG  capsule Take 1 capsule (100 mg total) by mouth daily. (Patient not taking: Reported on 01/10/2021)   No facility-administered encounter medications on file as of 01/10/2021.

## 2021-01-10 NOTE — Patient Instructions (Signed)
Call around and find the best cost for an MRI for your knee.

## 2021-01-11 ENCOUNTER — Encounter: Payer: Self-pay | Admitting: Family Medicine

## 2021-01-15 NOTE — Addendum Note (Signed)
Addended by: Owens Loffler on: 01/15/2021 10:32 AM   Modules accepted: Orders

## 2021-01-24 ENCOUNTER — Telehealth: Payer: Self-pay | Admitting: Family Medicine

## 2021-01-24 NOTE — Progress Notes (Signed)
Pt called checking on the status of his MRI

## 2021-01-24 NOTE — Telephone Encounter (Signed)
error 

## 2021-01-27 NOTE — Telephone Encounter (Signed)
John Wall, can you help - I made a referral 01/15/2021 for his knee MRI.  The patient specifically requests that his imaging be done at the following location.   MRI referral sent to [facility] ([address], [place], [place], [place] [address]). Their Fax is [phone], and their phone number is [phone]. The MRI is for the left knee, without contrast (Code [id number]).

## 2021-01-28 NOTE — Telephone Encounter (Signed)
This Order was faxed on 01/23/21 to    [facility] Addresses:     [place] [address] ERXVQ:[phone] DTO:[phone]  The patient can call to schedule.

## 2021-02-07 DIAGNOSIS — S83272A Complex tear of lateral meniscus, current injury, left knee, initial encounter: Secondary | ICD-10-CM | POA: Diagnosis not present

## 2021-02-11 ENCOUNTER — Telehealth: Payer: Self-pay | Admitting: Family Medicine

## 2021-02-11 NOTE — Telephone Encounter (Signed)
Pt dropped off a MRI cd and also pt stated that he would like to do less milligrams on medication ( Dislofenac)

## 2021-02-12 MED ORDER — DICLOFENAC SODIUM 50 MG PO TBEC: 50.0000 mg | DELAYED_RELEASE_TABLET | Freq: Two times a day (BID) | ORAL | 2 refills | Status: DC

## 2021-02-12 NOTE — Telephone Encounter (Signed)
Medication dose changed has been addressed through mychart already

## 2021-02-14 DIAGNOSIS — Z20822 Contact with and (suspected) exposure to covid-19: Secondary | ICD-10-CM | POA: Diagnosis not present

## 2021-02-14 DIAGNOSIS — U071 COVID-19: Secondary | ICD-10-CM | POA: Diagnosis not present

## 2021-02-18 ENCOUNTER — Ambulatory Visit (INDEPENDENT_AMBULATORY_CARE_PROVIDER_SITE_OTHER): Payer: BC Managed Care – PPO | Admitting: Family Medicine

## 2021-02-18 ENCOUNTER — Encounter: Payer: Self-pay | Admitting: Family Medicine

## 2021-02-18 ENCOUNTER — Other Ambulatory Visit: Payer: Self-pay

## 2021-02-18 VITALS — BP 140/82 | HR 82 | Temp 98.1°F | Ht 65.25 in | Wt 158.2 lb

## 2021-02-18 DIAGNOSIS — I10 Essential (primary) hypertension: Secondary | ICD-10-CM

## 2021-02-18 DIAGNOSIS — Z0001 Encounter for general adult medical examination with abnormal findings: Secondary | ICD-10-CM

## 2021-02-18 DIAGNOSIS — Z125 Encounter for screening for malignant neoplasm of prostate: Secondary | ICD-10-CM

## 2021-02-18 DIAGNOSIS — U071 COVID-19: Secondary | ICD-10-CM | POA: Diagnosis not present

## 2021-02-18 DIAGNOSIS — S83272D Complex tear of lateral meniscus, current injury, left knee, subsequent encounter: Secondary | ICD-10-CM | POA: Diagnosis not present

## 2021-02-18 DIAGNOSIS — D179 Benign lipomatous neoplasm, unspecified: Secondary | ICD-10-CM | POA: Insufficient documentation

## 2021-02-18 DIAGNOSIS — E785 Hyperlipidemia, unspecified: Secondary | ICD-10-CM

## 2021-02-18 DIAGNOSIS — D171 Benign lipomatous neoplasm of skin and subcutaneous tissue of trunk: Secondary | ICD-10-CM

## 2021-02-18 HISTORY — DX: COVID-19: U07.1

## 2021-02-18 MED ORDER — AMLODIPINE BESYLATE 5 MG PO TABS: 5.0000 mg | ORAL_TABLET | Freq: Every day | ORAL | 3 refills | Status: DC

## 2021-02-18 NOTE — Assessment & Plan Note (Signed)
Preventative protocols reviewed and updated unless pt declined. Discussed healthy diet and lifestyle.  

## 2021-02-18 NOTE — Patient Instructions (Addendum)
Regresar a finales de [employer] o a principios de la semana entrante para chequear laboratorios.  Gusto verlo hoy.  Regresar en 1 ao para proximo examen fisico.   Mantenimiento de Teacher, English as a foreign language, Male Adoptar un estilo de vida saludable y recibir atencin preventiva son importantes para promover la salud y Musician. Consulte al mdico sobre: El esquema adecuado para hacerse pruebas y exmenes peridicos. Cosas que puede hacer por su cuenta para prevenir enfermedades y [place] sano. Qu debo saber sobre la dieta, el peso y el ejercicio? Consuma una dieta saludable  Consuma una dieta que incluya muchas verduras, frutas, productos lcteos con bajo contenido de [place] y Advertising account planner. No consuma muchos alimentos ricos en grasas slidas, azcares agregados o sodio. Mantenga un peso saludable El ndice de masa muscular [facility]) es una medida que puede utilizarse para identificar posibles problemas de [place]. Proporciona una estimacin de la grasa corporal basndose en el peso y la altura. Su mdico puede ayudarle a Radiation protection practitioner [place] y a Scientist, forensic o Theatre manager un peso saludable. Haga ejercicio con regularidad Haga ejercicio con regularidad. Esta es una de las prcticas ms importantes que puede hacer por su salud. La [employer] de los adultos deben seguir estas pautas: Optometrist, al menos, 150 minutos de actividad fsica por semana. El ejercicio debe aumentar la frecuencia cardaca y Nature conservation officer transpirar (ejercicio de intensidad moderada). Hacer ejercicios de fortalecimiento por lo [employer] por semana. Agregue esto a su plan de ejercicio de intensidad moderada. Pasar menos tiempo sentados. Incluso la actividad fsica ligera puede ser beneficiosa. Controle sus niveles de colesterol y lpidos en la sangre Comience a realizarse anlisis de lpidos y Research officer, trade union en la sangre a los 25 aos y luego reptalos cada 5 aos. Es posible que Automotive engineer los niveles de  colesterol con mayor frecuencia si: Sus niveles de lpidos y colesterol son altos. Es mayor de 99 aos. Presenta un alto riesgo de padecer enfermedades cardacas. Qu debo saber sobre las pruebas de deteccin del cncer? Muchos tipos de cncer pueden detectarse de manera temprana y, a menudo, pueden prevenirse. Segn su historia clnica y sus antecedentes familiares, es posible que deba realizarse pruebas de deteccin del cncer en diferentes edades. Esto puede incluir pruebas de deteccin de lo siguiente: Surveyor, minerals. Cncer de prstata. Cncer de piel. Cncer de pulmn. Qu debo saber sobre la enfermedad cardaca, la diabetes y la hipertensin arterial? Presin arterial y enfermedad cardaca La hipertensin arterial causa enfermedades cardacas y [place] el riesgo de accidente cerebrovascular. Es ms probable que esto se manifieste en las personas que tienen lecturas de presin arterial alta, tienen ascendencia africana o tienen sobrepeso. Hable con el mdico sobre sus valores de presin arterial deseados. Hgase controlar la presin arterial: Cada 3 a 5 aos si tiene entre 18 y 14 aos. Todos los aos si es mayor de 40 aos. Si tiene entre 27 y 101 aos y es fumador o Insurance account manager, pregntele al mdico si debe realizarse una prueba de deteccin de aneurisma artico abdominal (AAA) por nica vez. Diabetes Realcese exmenes de deteccin de la diabetes con regularidad. Este anlisis revisa el nivel de azcar en la sangre en [place]. Hgase las pruebas de deteccin: Cada tres aos despus de los 52 aos de edad si tiene un peso normal y un bajo riesgo de padecer diabetes. Con ms frecuencia y a partir de [place] edad inferior si tiene sobrepeso o un alto riesgo de padecer diabetes. Qu debo saber sobre la prevencin  de infecciones? Hepatitis B Si tiene un riesgo ms alto de contraer hepatitis B, debe someterse a un examen de deteccin de este virus. Hable con el mdico para averiguar si  tiene riesgo de contraer la infeccin por hepatitis B. Hepatitis C Se recomienda un anlisis de [place] para: Todos los que nacieron entre 1945 y [phone]. Todas las personas que tengan un riesgo de haber contrado hepatitis C. Enfermedades de transmisin sexual (ETS) Debe realizarse pruebas de deteccin de ITS todos los aos, incluidas la gonorrea y la clamidia, si: Es sexualmente activo y es menor de 21 aos. Es mayor de 49 aos, y Investment banker, operational informa que corre riesgo de tener este tipo de infecciones. La actividad sexual ha cambiado desde que le hicieron la ltima prueba de deteccin y tiene un riesgo mayor de Best boy clamidia o Radio broadcast assistant. Pregntele al mdico si usted tiene riesgo. Pregntele al mdico si usted tiene un alto riesgo de Museum/gallery curator VIH. El mdico tambin puede recomendarle un medicamento recetado para ayudar a evitar la infeccin por el VIH. Si elige tomar medicamentos para prevenir el VIH, primero debe [employer] de deteccin del VIH. Luego debe hacerse anlisis cada 3 meses mientras est tomando los medicamentos. Siga estas instrucciones en su casa: Estilo de vida No consuma ningn producto que contenga nicotina o tabaco, como cigarrillos, cigarrillos electrnicos y tabaco de Higher education careers adviser. Si necesita ayuda para dejar de fumar, consulte al mdico. No consuma drogas. No comparta agujas. Solicite ayuda a su mdico si necesita apoyo o informacin para abandonar las drogas. Consumo de alcohol No beba alcohol si el mdico se lo prohbe. Si bebe alcohol: Limite la cantidad que consume de 0 a 2 medidas por da. Est atento a la cantidad de alcohol que hay en las bebidas que toma. En los [place], una medida equivale a una botella de cerveza de 12 oz (355 ml), un vaso de vino de 5 oz (148 ml) o un vaso de una bebida alcohlica de alta graduacin de 1 oz (44 ml). Instrucciones generales Realcese los estudios de rutina de la salud, dentales y de Public librarian. [place]. Infrmele a su mdico si: Se siente deprimido con frecuencia. Alguna vez ha sido vctima de [place] o no se siente seguro en su casa. Resumen Adoptar un estilo de vida saludable y recibir atencin preventiva son importantes para promover la salud y Musician. Siga las instrucciones del mdico acerca de una dieta saludable, el ejercicio y la realizacin de pruebas o exmenes para Engineer, building services. Siga las instrucciones del mdico con respecto al control del colesterol y la presin arterial. Esta informacin no tiene Marine scientist el consejo del mdico. Asegrese de hacerle al mdico cualquier pregunta que tenga. Document Revised: 05/12/2018 Document Reviewed: 05/12/2018 Elsevier Patient Education  [place].

## 2021-02-18 NOTE — Assessment & Plan Note (Signed)
Chronic, adequate on current regimen of amlodipine 5mg  - continue.

## 2021-02-18 NOTE — Assessment & Plan Note (Signed)
Presumed lipoma to R upper back discussed monitoring and letting me know if enlarging.

## 2021-02-18 NOTE — Assessment & Plan Note (Signed)
6 days into illness, currently finishing molnupiravir course. Will postpone lab visit until at least 10 days from illness.

## 2021-02-18 NOTE — Progress Notes (Signed)
Patient ID: John Wall, male    DOB: 01-16-1961, 60 y.o.   MRN: [id number]  This visit was conducted in person.  BP 140/82   Pulse 82   Temp 98.1 F (36.7 C) (Temporal)   Ht 5' 5.25" (1.657 m)   Wt 158 lb 4 oz (71.8 kg)   SpO2 98%   BMI 26.13 kg/m    CC: CPE Subjective:   HPI: John Wall is a 60 y.o. male presenting on 02/18/2021 for Annual Exam   COVID infection 02/12/2021 seen [facility] treated molnupiravir. Overall feeling better. Wife also sick.   Father with thoracic AAA rupture age 97 (significant alcohol use).   Saw Dr John Wall treating lower back pain with diclofenac. Also takes turmeric daily.    Preventative: Colon cancer screen - colonoscopy 2014 WNL John Wall) Prostate cancer screen - checked yearly, nocturia x2-3, urgency worse at night  Lung cancer screening - not eligible  Flu shot - declines - had back reaction to this 1996 and High [place] 07/2019, 08/2019, booster 05/2020 Tdap 07/2014  Pneumonia shot - not due Shingrix - 12/2019, 03/2020 Advanced directive discussion -  Seat belt use discussed  Sunscreen use discussed. No changing moles on skin.  Non smoker Alcohol - a few beers or drinks of liquor a week  Dentist - Q6 mo  Eye exam - yearly    From [place]  Lives with wife, son (2004)  Occ: Art gallery manager, drafting Financial trader) Activity: running 3 mi 2-3 times a day, bike riding Diet: good water, fruits/vegetables daily  Caffeine: 2 cups of coffee/day     Relevant past medical, surgical, family and social history reviewed and updated as indicated. Interim medical history since our last visit reviewed. Allergies and medications reviewed and updated. Outpatient Medications Prior to Visit  Medication Sig Dispense Refill   diclofenac (VOLTAREN) 50 MG EC tablet Take 1 tablet (50 mg total) by mouth 2 (two) times daily. 60 tablet 2   LAGEVRIO 200 MG CAPS capsule SMARTSIG:4 Each By Mouth Twice Daily     Multiple Vitamin  (MULTIVITAMIN) tablet Take 1 tablet by mouth daily.     Turmeric 500 MG TABS Take 1 tablet by mouth in the morning and at bedtime.     amLODipine (NORVASC) 5 MG tablet TAKE 1 TABLET BY MOUTH EVERY DAY 90 tablet 1   No facility-administered medications prior to visit.     Per HPI unless specifically indicated in ROS section below Review of Systems  Constitutional:  Positive for chills and fever. Negative for activity change, appetite change, fatigue and unexpected weight change.  HENT:  Positive for congestion. Negative for hearing loss.   Eyes:  Negative for visual disturbance.  Respiratory:  Positive for cough, shortness of breath and wheezing. Negative for chest tightness.   Cardiovascular:  Negative for chest pain, palpitations and leg swelling.  Gastrointestinal:  Negative for abdominal distention, abdominal pain, blood in stool, constipation, diarrhea, nausea and vomiting.  Genitourinary:  Negative for difficulty urinating and hematuria.  Musculoskeletal:  Positive for arthralgias. Negative for myalgias and neck pain.  Skin:  Negative for rash.  Neurological:  Positive for dizziness and headaches. Negative for seizures and syncope.  Hematological:  Negative for adenopathy. Does not bruise/bleed easily.  Psychiatric/Behavioral:  Negative for dysphoric mood. The patient is not nervous/anxious.    Objective:  BP 140/82   Pulse 82   Temp 98.1 F (36.7 C) (Temporal)   Ht 5' 5.25" (1.657  m)   Wt 158 lb 4 oz (71.8 kg)   SpO2 98%   BMI 26.13 kg/m   Wt Readings from Last 3 Encounters:  02/18/21 158 lb 4 oz (71.8 kg)  01/10/21 168 lb (76.2 kg)  12/13/20 165 lb 8 oz (75.1 kg)      Physical Exam Vitals and nursing note reviewed.  Constitutional:      General: He is not in acute distress.    Appearance: Normal appearance. He is well-developed. He is not ill-appearing.  HENT:     Head: Normocephalic and atraumatic.     Right Ear: Hearing, tympanic membrane, ear canal and external  ear normal.     Left Ear: Hearing, tympanic membrane, ear canal and external ear normal.  Eyes:     General: No scleral icterus.    Extraocular Movements: Extraocular movements intact.     Conjunctiva/sclera: Conjunctivae normal.     Pupils: Pupils are equal, round, and reactive to light.  Neck:     Thyroid: No thyroid mass or thyromegaly.  Cardiovascular:     Rate and Rhythm: Normal rate and regular rhythm.     Pulses: Normal pulses.          Radial pulses are 2+ on the right side and 2+ on the left side.     Heart sounds: Normal heart sounds. No murmur heard. Pulmonary:     Effort: Pulmonary effort is normal. No respiratory distress.     Breath sounds: Normal breath sounds. No wheezing, rhonchi or rales.  Abdominal:     General: Bowel sounds are normal. There is no distension.     Palpations: Abdomen is soft. There is no mass.     Tenderness: There is no abdominal tenderness. There is no guarding or rebound.     Hernia: No hernia is present.  Musculoskeletal:        General: Normal range of motion.     Cervical back: Normal range of motion and neck supple.     Right lower leg: No edema.     Left lower leg: No edema.  Lymphadenopathy:     Cervical: No cervical adenopathy.  Skin:    General: Skin is warm and dry.     Findings: No rash.     Comments: Small subcutaneous mass to right upper back  Neurological:     General: No focal deficit present.     Mental Status: He is alert and oriented to person, place, and time.  Psychiatric:        Mood and Affect: Mood normal.        Behavior: Behavior normal.        Thought Content: Thought content normal.        Judgment: Judgment normal.      Results for orders placed or performed in visit on 12/13/20  Rheumatoid factor  Result Value Ref Range   Rhuematoid fact SerPl-aCnc <74 <12 IU/mL  Cyclic citrul peptide antibody, IgG  Result Value Ref Range   Cyclic Citrullin Peptide Ab <16 UNITS  Sedimentation rate  Result Value Ref  Range   Sed Rate 2 0 - 20 mm/h  High sensitivity CRP  Result Value Ref Range   hs-CRP 1.9 mg/L  Synovial Fluid Analysis, Complete  Result Value Ref Range   Site NOT GIVEN    Color, Synovial YELLOW STRAW/YELLOW   Appearance-Synovial CLEAR CLEAR/HAZY   WBC, Synovial 345 (H) <150 cells/uL   Neutrophil, Synovial 12 0 - 24 %  Lymphocytes-Synovial Fld 7 0 - 74 %   Monocyte/Macrophage 80 (H) 0 - 69 %   Eosinophils-Synovial 0 0 - 2 %   Basophils, % 0 0 %   Synoviocytes, % 1 0 - 15 %   Crystals, Fluid  NONE SEEN /HPF  TIQ-NTM  Result Value Ref Range   QUESTION/PROBLEM:     SPECIMEN(S) RECEIVED: Specimen FL unneeded     Assessment & Plan:  This visit occurred during the SARS-CoV-2 public health emergency.  Safety protocols were in place, including screening questions prior to the visit, additional usage of staff PPE, and extensive cleaning of exam room while observing appropriate contact time as indicated for disinfecting solutions.   Problem List Items Addressed This Visit     Encounter for general adult medical examination with abnormal findings - Primary (Chronic)    Preventative protocols reviewed and updated unless pt declined. Discussed healthy diet and lifestyle.       HTN (hypertension)    Chronic, adequate on current regimen of amlodipine 5mg  - continue.       Relevant Medications   amLODipine (NORVASC) 5 MG tablet   Other Relevant Orders   Basic metabolic panel   Microalbumin / creatinine urine ratio   Lateral meniscal tear    Brings MRI showing complex left lateral meniscal tear - pending f/u with Dr John Wall.  Will forward MRI results attn Dr John Wall.  Continues diclofenac 50mg  BID with turmeric.       COVID-19 virus infection    6 days into illness, currently finishing molnupiravir course. Will postpone lab visit until at least 10 days from illness.       Relevant Medications   LAGEVRIO 200 MG CAPS capsule   Lipoma    Presumed lipoma to R upper back  discussed monitoring and letting me know if enlarging.       Other Visit Diagnoses     Special screening for malignant neoplasm of prostate       Relevant Orders   PSA   Dyslipidemia       Relevant Orders   Lipid panel        Meds ordered this encounter  Medications   amLODipine (NORVASC) 5 MG tablet    Sig: Take 1 tablet (5 mg total) by mouth daily.    Dispense:  90 tablet    Refill:  3   Orders Placed This Encounter  Procedures   Lipid panel    Standing Status:   Future    Standing Expiration Date:   02/18/2022   PSA    Standing Status:   Future    Standing Expiration Date:   79/07/8331   Basic metabolic panel    Standing Status:   Future    Standing Expiration Date:   02/18/2022   Microalbumin / creatinine urine ratio    Standing Status:   Future    Standing Expiration Date:   02/18/2022     Patient instructions: Regresar a finales de esta semana o a principios de la semana entrante para chequear laboratorios.  Gusto verlo hoy.  Regresar en 1 ao para proximo examen fisico.   Follow up plan: Return in about 1 year (around 02/18/2022), or if symptoms worsen or fail to improve, for annual exam, prior fasting for blood work.  Ria Bush, MD

## 2021-02-18 NOTE — Assessment & Plan Note (Addendum)
Brings MRI showing complex left lateral meniscal tear - pending f/u with Dr Lorelei Pont.  Will forward MRI results attn Dr Lorelei Pont.  Continues diclofenac 50mg  BID with turmeric.

## 2021-02-22 ENCOUNTER — Other Ambulatory Visit: Payer: Self-pay

## 2021-02-22 ENCOUNTER — Other Ambulatory Visit (INDEPENDENT_AMBULATORY_CARE_PROVIDER_SITE_OTHER): Payer: BC Managed Care – PPO

## 2021-02-22 ENCOUNTER — Other Ambulatory Visit: Payer: BC Managed Care – PPO

## 2021-02-22 DIAGNOSIS — I10 Essential (primary) hypertension: Secondary | ICD-10-CM

## 2021-02-22 DIAGNOSIS — Z125 Encounter for screening for malignant neoplasm of prostate: Secondary | ICD-10-CM | POA: Diagnosis not present

## 2021-02-22 DIAGNOSIS — E785 Hyperlipidemia, unspecified: Secondary | ICD-10-CM | POA: Diagnosis not present

## 2021-02-22 LAB — LIPID PANEL
Cholesterol: 172 mg/dL (ref 0–200)
HDL: 42.1 mg/dL (ref >39.00)
LDL Cholesterol: 106 mg/dL — ABNORMAL HIGH (ref 0–99)
NonHDL: 129.95
Total CHOL/HDL Ratio: 4
Triglycerides: 120 mg/dL (ref 0.0–149.0)
VLDL: 24 mg/dL (ref 0.0–40.0)

## 2021-02-22 LAB — BASIC METABOLIC PANEL
BUN: 14 mg/dL (ref 6–23)
CO2: 31 mEq/L (ref 19–32)
Calcium: 9.7 mg/dL (ref 8.4–10.5)
Chloride: 102 mEq/L (ref 96–112)
Creatinine, Ser: 0.76 mg/dL (ref 0.40–1.50)
GFR: 96.61 mL/min (ref >60.00)
Glucose, Bld: 91 mg/dL (ref 70–99)
Potassium: 4.1 mEq/L (ref 3.5–5.1)
Sodium: 141 mEq/L (ref 135–145)

## 2021-02-22 LAB — PSA: PSA: 1.85 ng/mL (ref 0.10–4.00)

## 2021-02-22 LAB — MICROALBUMIN / CREATININE URINE RATIO
Creatinine,U: 111.7 mg/dL
Microalb Creat Ratio: 1.7 mg/g (ref 0.0–30.0)
Microalb, Ur: 1.9 mg/dL (ref 0.0–1.9)

## 2021-03-06 ENCOUNTER — Other Ambulatory Visit: Payer: Self-pay | Admitting: Family Medicine

## 2021-04-16 DIAGNOSIS — M170 Bilateral primary osteoarthritis of knee: Secondary | ICD-10-CM | POA: Diagnosis not present

## 2021-05-01 DIAGNOSIS — M170 Bilateral primary osteoarthritis of knee: Secondary | ICD-10-CM | POA: Diagnosis not present

## 2021-05-08 DIAGNOSIS — M170 Bilateral primary osteoarthritis of knee: Secondary | ICD-10-CM | POA: Diagnosis not present

## 2021-05-15 DIAGNOSIS — M1712 Unilateral primary osteoarthritis, left knee: Secondary | ICD-10-CM | POA: Diagnosis not present

## 2021-05-15 DIAGNOSIS — M170 Bilateral primary osteoarthritis of knee: Secondary | ICD-10-CM | POA: Diagnosis not present

## 2021-11-15 DIAGNOSIS — M47816 Spondylosis without myelopathy or radiculopathy, lumbar region: Secondary | ICD-10-CM | POA: Diagnosis not present

## 2021-11-15 DIAGNOSIS — X500XXA Overexertion from strenuous movement or load, initial encounter: Secondary | ICD-10-CM | POA: Diagnosis not present

## 2022-01-03 ENCOUNTER — Ambulatory Visit: Payer: BC Managed Care – PPO | Admitting: Family Medicine

## 2022-01-03 ENCOUNTER — Encounter: Payer: Self-pay | Admitting: Family Medicine

## 2022-01-03 VITALS — BP 134/78 | HR 65 | Temp 98.0°F | Ht 65.25 in | Wt 167.2 lb

## 2022-01-03 DIAGNOSIS — R748 Abnormal levels of other serum enzymes: Secondary | ICD-10-CM

## 2022-01-03 DIAGNOSIS — R1900 Intra-abdominal and pelvic swelling, mass and lump, unspecified site: Secondary | ICD-10-CM

## 2022-01-03 NOTE — Assessment & Plan Note (Signed)
These have since normalized.  Reviewed CT from 2017 - showing 3 separate masses to liver thought benign hepatic hemangiomas.  Update abd [place].

## 2022-01-03 NOTE — Patient Instructions (Addendum)
Posible lipoma del abdomen. Mandaremos para sonograma de abdomen en [facility].

## 2022-01-03 NOTE — Progress Notes (Signed)
Patient ID: John Wall, male    DOB: October 07, 1960, 61 y.o.   MRN: [id number]  This visit was conducted in person.  BP 134/78   Pulse 65   Temp 98 F (36.7 C) (Temporal)   Ht 5' 5.25" (1.657 m)   Wt 167 lb 4 oz (75.9 kg)   SpO2 97%   BMI 27.62 kg/m    CC: check mass in abdomen Subjective:   HPI: John Wall is a 61 y.o. male presenting on 01/03/2022 for Mass (C/o bulge in RUQ of abd.  Denies pain. Noticed about 1 mo ago.  Increased in size about 1 wk ago. )   1 mo h/o skin mass to RUQ, then over the past week may be enlarging and developed dilated veins at this area.  No abd pain, fever/chills, nausea/vomiting.  No pain or itching or other rash.   H/o lipoma s/p excision 20 years ago.   Desires definitive therapy.      Relevant past medical, surgical, family and social history reviewed and updated as indicated. Interim medical history since our last visit reviewed. Allergies and medications reviewed and updated. Outpatient Medications Prior to Visit  Medication Sig Dispense Refill   amLODipine (NORVASC) 5 MG tablet TAKE 1 TABLET BY MOUTH EVERY DAY 90 tablet 3   diclofenac (VOLTAREN) 50 MG EC tablet Take 1 tablet (50 mg total) by mouth 2 (two) times daily. 60 tablet 2   LAGEVRIO 200 MG CAPS capsule SMARTSIG:4 Each By Mouth Twice Daily     Multiple Vitamin (MULTIVITAMIN) tablet Take 1 tablet by mouth daily.     Turmeric 500 MG TABS Take 1 tablet by mouth in the morning and at bedtime.     No facility-administered medications prior to visit.     Per HPI unless specifically indicated in ROS section below Review of Systems  Objective:  BP 134/78   Pulse 65   Temp 98 F (36.7 C) (Temporal)   Ht 5' 5.25" (1.657 m)   Wt 167 lb 4 oz (75.9 kg)   SpO2 97%   BMI 27.62 kg/m   Wt Readings from Last 3 Encounters:  01/03/22 167 lb 4 oz (75.9 kg)  02/18/21 158 lb 4 oz (71.8 kg)  01/10/21 168 lb (76.2 kg)      Physical Exam Vitals and nursing note reviewed.   Constitutional:      Appearance: Normal appearance. He is not ill-appearing.  Abdominal:     General: Bowel sounds are normal. There is no distension.     Palpations: Abdomen is soft. There is mass.     Tenderness: There is no abdominal tenderness. There is no guarding or rebound. Negative signs include Murphy's sign.     Hernia: No hernia is present. There is no hernia in the umbilical area or ventral area.       Comments: Mild swelling overlying R lower costal margin without obvious mass, telangectasia present as well on skin  Neurological:     Mental Status: He is alert.       Results for orders placed or performed in visit on 02/22/21  Microalbumin / creatinine urine ratio  Result Value Ref Range   Microalb, Ur 1.9 0.0 - 1.9 mg/dL   Creatinine,U 111.7 mg/dL   Microalb Creat Ratio 1.7 0.0 - 30.0 mg/g  Basic metabolic panel  Result Value Ref Range   Sodium 141 135 - 145 mEq/L   Potassium 4.1 3.5 - 5.1 mEq/L   Chloride 102 96 -  112 mEq/L   CO2 31 19 - 32 mEq/L   Glucose, Bld 91 70 - 99 mg/dL   BUN 14 6 - 23 mg/dL   Creatinine, Ser 0.76 0.40 - 1.50 mg/dL   GFR 96.61 >60.00 mL/min   Calcium 9.7 8.4 - 10.5 mg/dL  PSA  Result Value Ref Range   PSA 1.85 0.10 - 4.00 ng/mL  Lipid panel  Result Value Ref Range   Cholesterol 172 0 - 200 mg/dL   Triglycerides 120.0 0.0 - 149.0 mg/dL   HDL 42.10 >39.00 mg/dL   VLDL 24.0 0.0 - 40.0 mg/dL   LDL Cholesterol 106 (H) 0 - 99 mg/dL   Total CHOL/HDL Ratio 4    NonHDL 129.95    Lab Results  Component Value Date   ALT 22 12/14/2019   AST 24 12/14/2019   ALKPHOS 78 12/14/2019   BILITOT 0.8 12/14/2019    CT ABDOMEN AND PELVIS WITH CONTRAST ADDENDUM REPORT: 08/02/2015 17:10 ADDENDUM: Unenhanced [facility] CT abdomen pelvis dated 12/22/2008 has been loaded for comparison. A 2.4 cm hypodense lesion is present in the left hepatic dome on that study (series 2/image 6), corresponding to one of the hypervascular lesions on the  current study. Additionally, a 2.5 cm hypodense lesion is present in the posterior segment right hepatic dome medially (series 2/image 9), corresponding to a 2nd hypervascular lesion on the current study. Additionally, there is a stable lobular contour along the gallbladder fossa (series 2/image 20), likely corresponding to a 3rd subtle lesion on the current CT, and accounting for the 3rd abnormality on recent ultrasound. Given 5 year stability, these can be considered benign. The two dome lesions likely reflect hemangiomas, while the gallbladder fossa lesion likely reflects a sclerosing hemangioma with overlying capsular retraction. Dedicated MR imaging is not considered necessary. Electronically Signed   By: Julian Hy M.D.   On: 08/02/2015 17:10  Assessment & Plan:   Problem List Items Addressed This Visit     Abnormal liver enzymes-2017    These have since normalized.  Reviewed CT from 2017 - showing 3 separate masses to liver thought benign hepatic hemangiomas.  Update abd [place].       Relevant Orders   US Abdomen Complete   Mass of soft tissue of abdomen - Primary    Evident swelling to right upper abdomen overlying lower costal margin however cannot appreciate obvious mass. Still anticipate lipoma as cause. Will check abdominal ultrasound for further evaluation. Pt agrees with plan. If lipoma, he would want definitive treatment.       Relevant Orders   US Abdomen Complete     No orders of the defined types were placed in this encounter.  Orders Placed This Encounter  Procedures   US Abdomen Complete    Standing Status:   Future    Standing Expiration Date:   01/04/2023    Scheduling Instructions:     Pt requests [facility] in [place] [phone] ph, fax [phone]    Order Specific Question:   Reason for Exam (SYMPTOM  OR DIAGNOSIS REQUIRED)    Answer:   RUQ soft tissue mass, transaminitis, h/o liver hemangioma x3     Order Specific Question:   Preferred imaging location?    Answer:   External     Patient Instructions  Posible lipoma del abdomen. Mandaremos para sonograma de abdomen en [facility].   Follow up plan: Return if symptoms worsen or fail to improve.  Ria Bush,  MD

## 2022-01-03 NOTE — Assessment & Plan Note (Signed)
Evident swelling to right upper abdomen overlying lower costal margin however cannot appreciate obvious mass. Still anticipate lipoma as cause. Will check abdominal ultrasound for further evaluation. Pt agrees with plan. If lipoma, he would want definitive treatment.

## 2022-01-09 ENCOUNTER — Encounter: Payer: Self-pay | Admitting: *Deleted

## 2022-01-11 ENCOUNTER — Other Ambulatory Visit: Payer: Self-pay | Admitting: Family Medicine

## 2022-01-18 DIAGNOSIS — K760 Fatty (change of) liver, not elsewhere classified: Secondary | ICD-10-CM | POA: Diagnosis not present

## 2022-01-22 ENCOUNTER — Telehealth: Payer: Self-pay | Admitting: Family Medicine

## 2022-01-22 DIAGNOSIS — N200 Calculus of kidney: Secondary | ICD-10-CM

## 2022-01-22 DIAGNOSIS — K760 Fatty (change of) liver, not elsewhere classified: Secondary | ICD-10-CM | POA: Insufficient documentation

## 2022-01-22 NOTE — Telephone Encounter (Signed)
Mailed results.  Lvm asking pt to call back.  Need to relay Dr. Synthia Innocent message.

## 2022-01-22 NOTE — Telephone Encounter (Signed)
Please notify patient I received the abdominal ultrasound results from [place], placed in Lisa's box. Would offer copy mailed to him.  It returned overall reassuring including overall normal gallbladder and liver.  He had signs of fatty liver changes which we will continue to monitor. He did have bilateral kidney stones that did not seem to be causing an issue.  He also had left kidney cysts that were similar to prior CT and likely benign. Encourage good water intake to keep urine dilute. There was no mass noted in the right upper abdomen specifically no obvious lipoma.

## 2022-01-24 NOTE — Telephone Encounter (Signed)
Spoke with patient.

## 2022-01-24 NOTE — Telephone Encounter (Signed)
Patient returned call, regarding his ultra sound results.He stated that he would prefer Dr Darnell Level to call him back,due to the language.

## 2022-02-19 ENCOUNTER — Encounter: Payer: Self-pay | Admitting: Family Medicine

## 2022-02-19 ENCOUNTER — Ambulatory Visit (INDEPENDENT_AMBULATORY_CARE_PROVIDER_SITE_OTHER): Payer: BC Managed Care – PPO | Admitting: Family Medicine

## 2022-02-19 VITALS — BP 134/82 | HR 70 | Temp 98.1°F | Ht 65.0 in | Wt 161.4 lb

## 2022-02-19 DIAGNOSIS — R011 Cardiac murmur, unspecified: Secondary | ICD-10-CM

## 2022-02-19 DIAGNOSIS — I10 Essential (primary) hypertension: Secondary | ICD-10-CM | POA: Diagnosis not present

## 2022-02-19 DIAGNOSIS — Z125 Encounter for screening for malignant neoplasm of prostate: Secondary | ICD-10-CM

## 2022-02-19 DIAGNOSIS — K760 Fatty (change of) liver, not elsewhere classified: Secondary | ICD-10-CM

## 2022-02-19 DIAGNOSIS — Z0000 Encounter for general adult medical examination without abnormal findings: Secondary | ICD-10-CM | POA: Diagnosis not present

## 2022-02-19 LAB — LIPID PANEL
Cholesterol: 187 mg/dL (ref 0–200)
HDL: 43.9 mg/dL (ref >39.00)
LDL Cholesterol: 115 mg/dL — ABNORMAL HIGH (ref 0–99)
NonHDL: 142.77
Total CHOL/HDL Ratio: 4
Triglycerides: 140 mg/dL (ref 0.0–149.0)
VLDL: 28 mg/dL (ref 0.0–40.0)

## 2022-02-19 LAB — COMPREHENSIVE METABOLIC PANEL
ALT: 27 U/L (ref 0–53)
AST: 29 U/L (ref 0–37)
Albumin: 4.7 g/dL (ref 3.5–5.2)
Alkaline Phosphatase: 74 U/L (ref 39–117)
BUN: 14 mg/dL (ref 6–23)
CO2: 31 mEq/L (ref 19–32)
Calcium: 9.4 mg/dL (ref 8.4–10.5)
Chloride: 100 mEq/L (ref 96–112)
Creatinine, Ser: 0.73 mg/dL (ref 0.40–1.50)
GFR: 97.11 mL/min (ref >60.00)
Glucose, Bld: 90 mg/dL (ref 70–99)
Potassium: 3.6 mEq/L (ref 3.5–5.1)
Sodium: 139 mEq/L (ref 135–145)
Total Bilirubin: 0.9 mg/dL (ref 0.2–1.2)
Total Protein: 7.1 g/dL (ref 6.0–8.3)

## 2022-02-19 LAB — PSA: PSA: 1.09 ng/mL (ref 0.10–4.00)

## 2022-02-19 LAB — MICROALBUMIN / CREATININE URINE RATIO
Creatinine,U: 96.8 mg/dL
Microalb Creat Ratio: 2.6 mg/g (ref 0.0–30.0)
Microalb, Ur: 2.5 mg/dL — ABNORMAL HIGH (ref 0.0–1.9)

## 2022-02-19 MED ORDER — AMLODIPINE BESYLATE 5 MG PO TABS: 5.0000 mg | ORAL_TABLET | Freq: Every day | ORAL | 3 refills | Status: DC

## 2022-02-19 NOTE — Assessment & Plan Note (Signed)
Chronic, stable on current regimen - continue. 

## 2022-02-19 NOTE — Assessment & Plan Note (Signed)
Preventative protocols reviewed and updated unless pt declined. Discussed healthy diet and lifestyle.  

## 2022-02-19 NOTE — Patient Instructions (Signed)
Labs today Gusto verlo hoy Regresar en 1 ao para proximo examen fisico  Health Maintenance, Male Adopting a healthy lifestyle and getting preventive care are important in promoting health and wellness. Ask your health care provider about: The right schedule for you to have regular tests and exams. Things you can do on your own to prevent diseases and keep yourself healthy. What should I know about diet, weight, and exercise? Eat a healthy diet  Eat a diet that includes plenty of vegetables, fruits, low-fat dairy products, and lean protein. Do not eat a lot of foods that are high in solid fats, added sugars, or sodium. Maintain a healthy weight Body mass index (BMI) is a measurement that can be used to identify possible weight problems. It estimates body fat based on height and weight. Your health care provider can help determine your BMI and help you achieve or maintain a healthy weight. Get regular exercise Get regular exercise. This is one of the most important things you can do for your health. Most adults should: Exercise for at least 150 minutes each week. The exercise should increase your heart rate and make you sweat (moderate-intensity exercise). Do strengthening exercises at least twice a week. This is in addition to the moderate-intensity exercise. Spend less time sitting. Even light physical activity can be beneficial. Watch cholesterol and blood lipids Have your blood tested for lipids and cholesterol at 63 years of age, then have this test every 5 years. You may need to have your cholesterol levels checked more often if: Your lipid or cholesterol levels are high. You are older than 63 years of age. You are at high risk for heart disease. What should I know about cancer screening? Many types of cancers can be detected early and may often be prevented. Depending on your health history and family history, you may need to have cancer screening at various ages. This may include  screening for: Colorectal cancer. Prostate cancer. Skin cancer. Lung cancer. What should I know about heart disease, diabetes, and high blood pressure? Blood pressure and heart disease High blood pressure causes heart disease and increases the risk of stroke. This is more likely to develop in people who have high blood pressure readings or are overweight. Talk with your health care provider about your target blood pressure readings. Have your blood pressure checked: Every 3-5 years if you are 54-30 years of age. Every year if you are 95 years old or older. If you are between the ages of 71 and 33 and are a current or former smoker, ask your health care provider if you should have a one-time screening for abdominal aortic aneurysm (AAA). Diabetes Have regular diabetes screenings. This checks your fasting blood sugar level. Have the screening done: Once every three years after age 70 if you are at a normal weight and have a low risk for diabetes. More often and at a younger age if you are overweight or have a high risk for diabetes. What should I know about preventing infection? Hepatitis B If you have a higher risk for hepatitis B, you should be screened for this virus. Talk with your health care provider to find out if you are at risk for hepatitis B infection. Hepatitis C Blood testing is recommended for: Everyone born from [birth year] through 1967. Anyone with known risk factors for hepatitis C. Sexually transmitted infections (STIs) You should be screened each year for STIs, including gonorrhea and chlamydia, if: You are sexually active and are younger than  63 years of age. You are older than 63 years of age and your health care provider tells you that you are at risk for this type of infection. Your sexual activity has changed since you were last screened, and you are at increased risk for chlamydia or gonorrhea. Ask your health care provider if you are at risk. Ask your health care  provider about whether you are at high risk for HIV. Your health care provider may recommend a prescription medicine to help prevent HIV infection. If you choose to take medicine to prevent HIV, you should first get tested for HIV. You should then be tested every 3 months for as long as you are taking the medicine. Follow these instructions at home: Alcohol use Do not drink alcohol if your health care provider tells you not to drink. If you drink alcohol: Limit how much you have to 0-2 drinks a day. Know how much alcohol is in your drink. In the [place] one drink equals one 12 oz bottle of beer (355 mL), one 5 oz glass of wine (148 mL), or one 1 oz glass of hard liquor (44 mL). Lifestyle Do not use any products that contain nicotine or tobacco. These products include cigarettes, chewing tobacco, and vaping devices, such as e-cigarettes. If you need help quitting, ask your health care provider. Do not use street drugs. Do not share needles. Ask your health care provider for help if you need support or information about quitting drugs. General instructions Schedule regular health, dental, and eye exams. Stay current with your vaccines. Tell your health care provider if: You often feel depressed. You have ever been abused or do not feel safe at home. Summary Adopting a healthy lifestyle and getting preventive care are important in promoting health and wellness. Follow your health care provider's instructions about healthy diet, exercising, and getting tested or screened for diseases. Follow your health care provider's instructions on monitoring your cholesterol and blood pressure. This information is not intended to replace advice given to you by your health care provider. Make sure you discuss any questions you have with your health care provider. Document Revised: 09/10/2020 Document Reviewed: 09/10/2020 Elsevier Patient Education  [place].

## 2022-02-19 NOTE — Progress Notes (Signed)
Patient ID: John Wall, male    DOB: 1960/10/02, 61 y.o.   MRN: [id number]  This visit was conducted in person.  BP 134/82   Pulse 70   Temp 98.1 F (36.7 C) (Temporal)   Ht '5\' 5"'$  (1.651 m)   Wt 161 lb 6 oz (73.2 kg)   SpO2 99%   BMI 26.85 kg/m    CC: CPE Subjective:   HPI: John Wall is a 61 y.o. male presenting on 02/19/2022 for Annual Exam   Father with thoracic AAA rupture age 56 (significant alcohol use).   Has cut down sugars and fats in diet.   Preventative: Colon cancer screen - colonoscopy 2014 WNL John Wall) Prostate cancer screen - checked yearly, nocturia x2-3, urgency worse at night  Lung cancer screening - not eligible  Flu shot - declines - had back reaction to this 1996 and [place] 07/2019, 08/2019, booster 05/2020 Tdap 07/2014  Pneumonia shot - not due Shingrix - 12/2019, 03/2020 Advanced directive discussion -  Seat belt use discussed  Sunscreen use discussed. No changing moles on skin.  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 Interim medical history since our last visit reviewed. Allergies and medications reviewed and updated. Outpatient Medications Prior to Visit  Medication Sig Dispense Refill   diclofenac (VOLTAREN) 50 MG EC tablet TAKE 1 TABLET BY MOUTH TWICE A DAY (Patient taking differently: Take 50 mg by mouth 2 (two) times daily. As needed) 60 tablet 2   Multiple Vitamin (MULTIVITAMIN) tablet Take 1 tablet by mouth daily.     amLODipine (NORVASC) 5 MG tablet TAKE 1 TABLET BY MOUTH EVERY DAY 90 tablet 3   LAGEVRIO 200 MG CAPS capsule SMARTSIG:4 Each By  Mouth Twice Daily     Turmeric 500 MG TABS Take 1 tablet by mouth in the morning and at bedtime.     No facility-administered medications prior to visit.     Per HPI unless specifically indicated in ROS section below Review of Systems  Constitutional:  Negative for activity change, appetite change, chills, fatigue, fever and unexpected weight change.  HENT:  Negative for hearing loss.   Eyes:  Positive for itching. Negative for visual disturbance.  Respiratory:  Negative for cough, chest tightness, shortness of breath and wheezing.   Cardiovascular:  Negative for chest pain, palpitations and leg swelling.  Gastrointestinal:  Negative for abdominal distention, abdominal pain, blood in stool, constipation, diarrhea, nausea and vomiting.  Genitourinary:  Negative for difficulty urinating and hematuria.  Musculoskeletal:  Negative for arthralgias, myalgias and neck pain.  Skin:  Negative for rash.  Neurological:  Negative for dizziness, seizures, syncope and headaches.  Hematological:  Negative for adenopathy. Does not bruise/bleed easily.  Psychiatric/Behavioral:  Negative for dysphoric mood. The patient is not nervous/anxious.     Objective:  BP 134/82   Pulse 70   Temp 98.1 F (36.7 C) (Temporal)   Ht '5\' 5"'$  (1.651 m)   Wt 161 lb 6 oz (73.2 kg)   SpO2 99%   BMI 26.85 kg/m   Wt Readings from Last 3 Encounters:  02/19/22 161  lb 6 oz (73.2 kg)  01/03/22 167 lb 4 oz (75.9 kg)  02/18/21 158 lb 4 oz (71.8 kg)      Physical Exam Vitals and nursing note reviewed.  Constitutional:      General: He is not in acute distress.    Appearance: Normal appearance. He is well-developed. He is not ill-appearing.  HENT:     Head: Normocephalic and atraumatic.     Right Ear: Hearing, tympanic membrane, ear canal and external ear normal.     Left Ear: Hearing, tympanic membrane, ear canal and external ear normal.  Eyes:     General: No scleral icterus.    Extraocular Movements: Extraocular  movements intact.     Conjunctiva/sclera: Conjunctivae normal.     Pupils: Pupils are equal, round, and reactive to light.  Neck:     Thyroid: No thyroid mass or thyromegaly.  Cardiovascular:     Rate and Rhythm: Normal rate and regular rhythm.     Pulses: Normal pulses.          Radial pulses are 2+ on the right side and 2+ on the left side.     Heart sounds: Murmur (3-2/9 systolic at apex) heard.  Pulmonary:     Effort: Pulmonary effort is normal. No respiratory distress.     Breath sounds: Normal breath sounds. No wheezing, rhonchi or rales.  Abdominal:     General: Bowel sounds are normal. There is no distension.     Palpations: Abdomen is soft. There is no mass.     Tenderness: There is no abdominal tenderness. There is no guarding or rebound.     Hernia: No hernia is present.  Musculoskeletal:        General: Normal range of motion.     Cervical back: Normal range of motion and neck supple.     Right lower leg: No edema.     Left lower leg: No edema.  Lymphadenopathy:     Cervical: No cervical adenopathy.  Skin:    General: Skin is warm and dry.     Findings: No rash.  Neurological:     General: No focal deficit present.     Mental Status: He is alert and oriented to person, place, and time.  Psychiatric:        Mood and Affect: Mood normal.        Behavior: Behavior normal.        Thought Content: Thought content normal.        Judgment: Judgment normal.       Results for orders placed or performed in visit on 02/22/21  Microalbumin / creatinine urine ratio  Result Value Ref Range   Microalb, Ur 1.9 0.0 - 1.9 mg/dL   Creatinine,U 111.7 mg/dL   Microalb Creat Ratio 1.7 0.0 - 30.0 mg/g  Basic metabolic panel  Result Value Ref Range   Sodium 141 135 - 145 mEq/L   Potassium 4.1 3.5 - 5.1 mEq/L   Chloride 102 96 - 112 mEq/L   CO2 31 19 - 32 mEq/L   Glucose, Bld 91 70 - 99 mg/dL   BUN 14 6 - 23 mg/dL   Creatinine, Ser 0.76 0.40 - 1.50 mg/dL   GFR 96.61 >60.00  mL/min   Calcium 9.7 8.4 - 10.5 mg/dL  PSA  Result Value Ref Range   PSA 1.85 0.10 - 4.00 ng/mL  Lipid panel  Result Value Ref Range   Cholesterol 172 0 - 200 mg/dL   Triglycerides  120.0 0.0 - 149.0 mg/dL   HDL 42.10 >39.00 mg/dL   VLDL 24.0 0.0 - 40.0 mg/dL   LDL Cholesterol 106 (H) 0 - 99 mg/dL   Total CHOL/HDL Ratio 4    NonHDL 129.95    Lab Results  Component Value Date   TSH 1.45 12/14/2019    Assessment & Plan:   Problem List Items Addressed This Visit     Health maintenance examination - Primary (Chronic)    Preventative protocols reviewed and updated unless pt declined. Discussed healthy diet and lifestyle.       HTN (hypertension)    Chronic, stable on current regimen - continue.       Relevant Medications   amLODipine (NORVASC) 5 MG tablet   Other Relevant Orders   Lipid panel   Comprehensive metabolic panel   Microalbumin / creatinine urine ratio   Murmur    Saw cardiology concerning for acute MVP almost 10 yrs ago.  Did not undergo recommended echocardiogram or stress test. Completely asymptomatic since. Advised let me know if any cardiac symptoms develop for further eval.       NAFLD (nonalcoholic fatty liver disease)    Stable period. Update LFTs.      Other Visit Diagnoses     Special screening for malignant neoplasm of prostate       Relevant Orders   PSA        Meds ordered this encounter  Medications   amLODipine (NORVASC) 5 MG tablet    Sig: Take 1 tablet (5 mg total) by mouth daily.    Dispense:  90 tablet    Refill:  3   Orders Placed This Encounter  Procedures   Lipid panel   Comprehensive metabolic panel   PSA   Microalbumin / creatinine urine ratio    Patient instructions: Labs today Gusto verlo hoy Regresar en 1 ao para proximo examen fisico  Follow up plan: No follow-ups on file.  Ria Bush, MD

## 2022-02-19 NOTE — Assessment & Plan Note (Signed)
Saw cardiology concerning for acute MVP almost 10 yrs ago.  Did not undergo recommended echocardiogram or stress test. Completely asymptomatic since. Advised let me know if any cardiac symptoms develop for further eval.

## 2022-02-19 NOTE — Assessment & Plan Note (Addendum)
Stable period. Update LFTs.

## 2023-02-24 ENCOUNTER — Ambulatory Visit (INDEPENDENT_AMBULATORY_CARE_PROVIDER_SITE_OTHER): Payer: BC Managed Care – PPO | Admitting: Family Medicine

## 2023-02-24 ENCOUNTER — Encounter: Payer: Self-pay | Admitting: Family Medicine

## 2023-02-24 VITALS — BP 130/76 | HR 70 | Temp 97.8°F | Ht 65.0 in | Wt 166.2 lb

## 2023-02-24 DIAGNOSIS — I10 Essential (primary) hypertension: Secondary | ICD-10-CM | POA: Diagnosis not present

## 2023-02-24 DIAGNOSIS — Z125 Encounter for screening for malignant neoplasm of prostate: Secondary | ICD-10-CM

## 2023-02-24 DIAGNOSIS — Z1211 Encounter for screening for malignant neoplasm of colon: Secondary | ICD-10-CM

## 2023-02-24 DIAGNOSIS — R011 Cardiac murmur, unspecified: Secondary | ICD-10-CM

## 2023-02-24 DIAGNOSIS — R6882 Decreased libido: Secondary | ICD-10-CM | POA: Diagnosis not present

## 2023-02-24 DIAGNOSIS — Z0000 Encounter for general adult medical examination without abnormal findings: Secondary | ICD-10-CM

## 2023-02-24 DIAGNOSIS — K760 Fatty (change of) liver, not elsewhere classified: Secondary | ICD-10-CM | POA: Diagnosis not present

## 2023-02-24 DIAGNOSIS — Z7189 Other specified counseling: Secondary | ICD-10-CM | POA: Insufficient documentation

## 2023-02-24 LAB — LIPID PANEL
Cholesterol: 202 mg/dL — ABNORMAL HIGH (ref 0–200)
HDL: 46.2 mg/dL (ref >39.00)
LDL Cholesterol: 126 mg/dL — ABNORMAL HIGH (ref 0–99)
NonHDL: 156.09
Total CHOL/HDL Ratio: 4
Triglycerides: 150 mg/dL — ABNORMAL HIGH (ref 0.0–149.0)
VLDL: 30 mg/dL (ref 0.0–40.0)

## 2023-02-24 LAB — COMPREHENSIVE METABOLIC PANEL
ALT: 24 U/L (ref 0–53)
AST: 26 U/L (ref 0–37)
Albumin: 4.6 g/dL (ref 3.5–5.2)
Alkaline Phosphatase: 82 U/L (ref 39–117)
BUN: 13 mg/dL (ref 6–23)
CO2: 30 meq/L (ref 19–32)
Calcium: 9.3 mg/dL (ref 8.4–10.5)
Chloride: 101 meq/L (ref 96–112)
Creatinine, Ser: 0.74 mg/dL (ref 0.40–1.50)
GFR: 96.03 mL/min (ref >60.00)
Glucose, Bld: 95 mg/dL (ref 70–99)
Potassium: 3.7 meq/L (ref 3.5–5.1)
Sodium: 140 meq/L (ref 135–145)
Total Bilirubin: 0.8 mg/dL (ref 0.2–1.2)
Total Protein: 7.1 g/dL (ref 6.0–8.3)

## 2023-02-24 LAB — MICROALBUMIN / CREATININE URINE RATIO
Creatinine,U: 99.3 mg/dL
Microalb Creat Ratio: 2.3 mg/g (ref 0.0–30.0)
Microalb, Ur: 2.3 mg/dL — ABNORMAL HIGH (ref 0.0–1.9)

## 2023-02-24 LAB — TESTOSTERONE: Testosterone: 256.3 ng/dL — ABNORMAL LOW (ref 300.00–890.00)

## 2023-02-24 LAB — PSA: PSA: 1.26 ng/mL (ref 0.10–4.00)

## 2023-02-24 MED ORDER — AMLODIPINE BESYLATE 5 MG PO TABS: 5.0000 mg | ORAL_TABLET | Freq: Every day | ORAL | 4 refills | Status: DC

## 2023-02-24 NOTE — Assessment & Plan Note (Addendum)
Mild, stable.  Possible h/o MVP remotely Pt asxs.

## 2023-02-24 NOTE — Assessment & Plan Note (Signed)
Advanced directive discussion - does not have this at home, wife and children would be HCPOA. Full code.

## 2023-02-24 NOTE — Patient Instructions (Addendum)
Lo remitiremos de vuelta al gastroenterologo para repetir colonoscopia.  Puede regresar al Dr Patsy Lager medicina deportista - para discutir inyeccion de acido hyaluronico.  Siga amlodipine para presion arterial.  Regresar en 1 ao para proximo examen fisico.

## 2023-02-24 NOTE — Assessment & Plan Note (Signed)
Chronic, stable. Continue current regimen. 

## 2023-02-24 NOTE — Assessment & Plan Note (Signed)
Preventative protocols reviewed and updated unless pt declined. Discussed healthy diet and lifestyle.  

## 2023-02-24 NOTE — Assessment & Plan Note (Signed)
Update labs.  

## 2023-02-24 NOTE — Progress Notes (Signed)
Ph: [phone] Fax: [phone]   Patient ID: John Wall, male    DOB: 1960-11-24, 62 y.o.   MRN: [id number]  This visit was conducted in person.  BP 130/76   Pulse 70   Temp 97.8 F (36.6 C) (Oral)   Ht 5\' 5"  (1.651 m)   Wt 166 lb 4 oz (75.4 kg)   SpO2 98%   BMI 27.67 kg/m    CC: CPE Subjective:   HPI: John Wall is a 62 y.o. male presenting on 02/24/2023 for Annual Exam   Factory closed, now working in Teaching laboratory technician.  Father with thoracic AAA rupture age 69 (significant alcohol use).    Knee pain is better - he did receive hyaluronic viscosupplementation injections in [facility] (2023).   Notes decreased libido.  Normal energy, no depressed mood.   Preventative: Colonoscopy 2014 WNL Arlyce Dice) - will re-refer  Prostate cancer screen - checked yearly, nocturia x2-3.  Lung cancer screening - not eligible  Flu shot - declines - back reaction to this 1996 and 2000  COVID vaccine - Pfizer 07/2019, 08/2019, booster 05/2020 Tdap 07/2014  Pneumonia shot - not due Shingrix - 12/2019, 03/2020 Advanced directive discussion - does not have this at home, wife and children would be HCPOA. Full code.  Seat belt use discussed  Sunscreen use discussed. No changing moles on skin.  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 Interim medical history since our last visit reviewed. Allergies and medications reviewed and updated. Outpatient Medications Prior to Visit  Medication Sig Dispense Refill   Multiple Vitamin (MULTIVITAMIN) tablet Take 1 tablet by mouth daily.      amLODipine (NORVASC) 5 MG tablet Take 1 tablet (5 mg total) by mouth daily. 90 tablet 3   diclofenac (VOLTAREN) 50 MG EC tablet TAKE 1 TABLET BY MOUTH TWICE A DAY (Patient taking differently: Take 50 mg by mouth 2 (two) times daily. As needed) 60 tablet 2   No facility-administered medications prior to visit.     Per HPI unless specifically indicated in ROS section below Review of Systems  Constitutional:  Negative for activity change, appetite change, chills, fatigue, fever and unexpected weight change.  HENT:  Negative for hearing loss.   Eyes:  Negative for visual disturbance.  Respiratory:  Negative for cough, chest tightness, shortness of breath and wheezing.   Cardiovascular:  Negative for chest pain, palpitations and leg swelling.  Gastrointestinal:  Negative for abdominal distention, abdominal pain, blood in stool, constipation, diarrhea, nausea and vomiting.  Genitourinary:  Negative for difficulty urinating and hematuria.  Musculoskeletal:  Negative for arthralgias, myalgias and neck pain.  Skin:  Negative for rash.  Neurological:  Negative for dizziness, seizures, syncope and headaches.  Hematological:  Negative for adenopathy. Does not bruise/bleed easily.  Psychiatric/Behavioral:  Negative for dysphoric mood. The patient is not nervous/anxious.     Objective:  BP 130/76   Pulse 70   Temp 97.8 F (36.6 C) (Oral)   Ht 5\' 5"  (1.651 m)   Wt 166 lb 4 oz (75.4 kg)   SpO2  98%   BMI 27.67 kg/m   Wt Readings from Last 3 Encounters:  02/24/23 166 lb 4 oz (75.4 kg)  02/19/22 161 lb 6 oz (73.2 kg)  01/03/22 167 lb 4 oz (75.9 kg)      Physical Exam Vitals and nursing note reviewed.  Constitutional:      General: He is not in acute distress.    Appearance: Normal appearance. He is well-developed. He is not ill-appearing.  HENT:     Head: Normocephalic and atraumatic.     Right Ear: Hearing, tympanic membrane, ear canal and external ear normal.     Left Ear: Hearing,  tympanic membrane, ear canal and external ear normal.     Nose: Nose normal.     Mouth/Throat:     Mouth: Mucous membranes are moist.     Pharynx: Oropharynx is clear. No oropharyngeal exudate or posterior oropharyngeal erythema.  Eyes:     General: No scleral icterus.    Extraocular Movements: Extraocular movements intact.     Conjunctiva/sclera: Conjunctivae normal.     Pupils: Pupils are equal, round, and reactive to light.  Neck:     Thyroid: No thyroid mass or thyromegaly.  Cardiovascular:     Rate and Rhythm: Normal rate and regular rhythm.     Pulses: Normal pulses.          Radial pulses are 2+ on the right side and 2+ on the left side.     Heart sounds: Normal heart sounds. No murmur heard. Pulmonary:     Effort: Pulmonary effort is normal. No respiratory distress.     Breath sounds: Normal breath sounds. No wheezing, rhonchi or rales.  Abdominal:     General: Bowel sounds are normal. There is no distension.     Palpations: Abdomen is soft. There is no mass.     Tenderness: There is no abdominal tenderness. There is no guarding or rebound.     Hernia: No hernia is present.  Musculoskeletal:        General: Normal range of motion.     Cervical back: Normal range of motion and neck supple.     Right lower leg: No edema.     Left lower leg: No edema.  Lymphadenopathy:     Cervical: No cervical adenopathy.  Skin:    General: Skin is warm and dry.     Findings: No rash.  Neurological:     General: No focal deficit present.     Mental Status: He is alert and oriented to person, place, and time.  Psychiatric:        Mood and Affect: Mood normal.        Behavior: Behavior normal.        Thought Content: Thought content normal.        Judgment: Judgment normal.       Results for orders placed or performed in visit on 02/19/22  Lipid panel  Result Value Ref Range   Cholesterol 187 0 - 200 mg/dL   Triglycerides 784.6 0.0 - 149.0 mg/dL   HDL 96.29 >52.84 mg/dL   VLDL  13.2 0.0 - 44.0 mg/dL   LDL Cholesterol 102 (H) 0 - 99 mg/dL   Total CHOL/HDL Ratio 4    NonHDL 142.77   Comprehensive metabolic panel  Result Value Ref Range   Sodium 139 135 - 145 mEq/L   Potassium 3.6 3.5 - 5.1 mEq/L   Chloride 100 96 - 112 mEq/L   CO2 31 19 -  32 mEq/L   Glucose, Bld 90 70 - 99 mg/dL   BUN 14 6 - 23 mg/dL   Creatinine, Ser 0.98 0.40 - 1.50 mg/dL   Total Bilirubin 0.9 0.2 - 1.2 mg/dL   Alkaline Phosphatase 74 39 - 117 U/L   AST 29 0 - 37 U/L   ALT 27 0 - 53 U/L   Total Protein 7.1 6.0 - 8.3 g/dL   Albumin 4.7 3.5 - 5.2 g/dL   GFR 11.91 >47.82 mL/min   Calcium 9.4 8.4 - 10.5 mg/dL  PSA  Result Value Ref Range   PSA 1.09 0.10 - 4.00 ng/mL  Microalbumin / creatinine urine ratio  Result Value Ref Range   Microalb, Ur 2.5 (H) 0.0 - 1.9 mg/dL   Creatinine,U 95.6 mg/dL   Microalb Creat Ratio 2.6 0.0 - 30.0 mg/g    Assessment & Plan:   Problem List Items Addressed This Visit     Health maintenance examination - Primary (Chronic)    Preventative protocols reviewed and updated unless pt declined. Discussed healthy diet and lifestyle.       Advanced directives, counseling/discussion (Chronic)    Advanced directive discussion - does not have this at home, wife and children would be HCPOA. Full code.       HTN (hypertension)    Chronic, stable. Continue current regimen.       Relevant Medications   amLODipine (NORVASC) 5 MG tablet   Other Relevant Orders   Microalbumin / creatinine urine ratio   Murmur    Mild, stable.  Possible h/o MVP remotely Pt asxs.       NAFLD (nonalcoholic fatty liver disease)    Update labs.      Relevant Orders   Lipid panel   Comprehensive metabolic panel   Other Visit Diagnoses     Special screening for malignant neoplasm of prostate       Relevant Orders   PSA   Special screening for malignant neoplasms, colon       Relevant Orders   Ambulatory referral to Gastroenterology   Decreased libido       Relevant  Orders   Testosterone        Meds ordered this encounter  Medications   amLODipine (NORVASC) 5 MG tablet    Sig: Take 1 tablet (5 mg total) by mouth daily.    Dispense:  90 tablet    Refill:  4    Orders Placed This Encounter  Procedures   Lipid panel   Comprehensive metabolic panel   PSA   Microalbumin / creatinine urine ratio   Testosterone   Ambulatory referral to Gastroenterology    Referral Priority:   Routine    Referral Type:   Consultation    Referral Reason:   Specialty Services Required    Number of Visits Requested:   1    Patient Instructions  Lo remitiremos de vuelta al gastroenterologo para repetir colonoscopia.  Puede regresar al Dr Patsy Lager medicina deportista - para discutir inyeccion de acido hyaluronico.  Siga amlodipine para presion arterial.  Regresar en 1 ao para proximo examen fisico.   Follow up plan: Return in about 1 year (around 02/24/2024) for annual exam, prior fasting for blood work.  Eustaquio Boyden, MD

## 2023-02-25 ENCOUNTER — Other Ambulatory Visit: Payer: Self-pay | Admitting: Family Medicine

## 2023-02-25 DIAGNOSIS — R7989 Other specified abnormal findings of blood chemistry: Secondary | ICD-10-CM | POA: Insufficient documentation

## 2023-04-05 HISTORY — PX: COLONOSCOPY: SHX174

## 2023-04-06 ENCOUNTER — Other Ambulatory Visit: Payer: BC Managed Care – PPO

## 2023-04-09 ENCOUNTER — Ambulatory Visit: Payer: BC Managed Care – PPO

## 2023-04-09 VITALS — Ht 65.0 in | Wt 170.0 lb

## 2023-04-09 DIAGNOSIS — Z1211 Encounter for screening for malignant neoplasm of colon: Secondary | ICD-10-CM

## 2023-04-09 MED ORDER — NA SULFATE-K SULFATE-MG SULF 17.5-3.13-1.6 GM/177ML PO SOLN: 1.0000 | Freq: Once | ORAL | 0 refills | Status: AC

## 2023-04-09 NOTE — Progress Notes (Signed)

## 2023-04-10 ENCOUNTER — Encounter: Payer: Self-pay | Admitting: Gastroenterology

## 2023-04-23 ENCOUNTER — Encounter: Payer: Self-pay | Admitting: Gastroenterology

## 2023-04-23 ENCOUNTER — Ambulatory Visit: Payer: BC Managed Care – PPO | Admitting: Gastroenterology

## 2023-04-23 VITALS — BP 108/67 | HR 62 | Temp 99.0°F | Resp 11 | Ht 65.0 in | Wt 170.0 lb

## 2023-04-23 DIAGNOSIS — K5730 Diverticulosis of large intestine without perforation or abscess without bleeding: Secondary | ICD-10-CM

## 2023-04-23 DIAGNOSIS — D125 Benign neoplasm of sigmoid colon: Secondary | ICD-10-CM

## 2023-04-23 DIAGNOSIS — K5140 Inflammatory polyps of colon without complications: Secondary | ICD-10-CM | POA: Diagnosis not present

## 2023-04-23 DIAGNOSIS — K5520 Angiodysplasia of colon without hemorrhage: Secondary | ICD-10-CM | POA: Diagnosis not present

## 2023-04-23 DIAGNOSIS — K648 Other hemorrhoids: Secondary | ICD-10-CM | POA: Diagnosis not present

## 2023-04-23 DIAGNOSIS — D123 Benign neoplasm of transverse colon: Secondary | ICD-10-CM | POA: Diagnosis not present

## 2023-04-23 DIAGNOSIS — Z1211 Encounter for screening for malignant neoplasm of colon: Secondary | ICD-10-CM | POA: Diagnosis not present

## 2023-04-23 MED ORDER — SODIUM CHLORIDE 0.9 % IV SOLN: 500.0000 mL | Freq: Once | INTRAVENOUS | Status: DC

## 2023-04-23 NOTE — Op Note (Signed)
[facility] Patient Name: John Wall Procedure Date: 04/23/2023 1:21 PM MRN: [id number] Endoscopist: Viviann Spare P. Adela Lank , MD, [id number] Age: 62 Referring MD:  Date of Birth: [birth year] Gender: Male Account #: [id number] Procedure:                Colonoscopy Indications:              Screening for colorectal malignant neoplasm Medicines:                Monitored Anesthesia Care Procedure:                Pre-Anesthesia Assessment:                           - Prior to the procedure, a History and Physical                            was performed, and patient medications and                            allergies were reviewed. The patient's tolerance of                            previous anesthesia was also reviewed. The risks                            and benefits of the procedure and the sedation                            options and risks were discussed with the patient.                            All questions were answered, and informed consent                            was obtained. Prior Anticoagulants: The patient has                            taken no anticoagulant or antiplatelet agents. ASA                            Grade Assessment: II - A patient with mild systemic                            disease. After reviewing the risks and benefits,                            the patient was deemed in satisfactory condition to                            undergo the procedure.                           After obtaining informed consent, the colonoscope  was passed under direct vision. Throughout the                            procedure, the patient's blood pressure, pulse, and                            oxygen saturations were monitored continuously. The                            Olympus Scope SN: [id number] was introduced through                            the anus and advanced to the the cecum, identified                            by  appendiceal orifice and ileocecal valve. The                            colonoscopy was performed without difficulty. The                            patient tolerated the procedure well. The quality                            of the bowel preparation was good. The ileocecal                            valve, appendiceal orifice, and rectum were                            photographed. Scope In: 1:29:29 PM Scope Out: 1:48:54 PM Scope Withdrawal Time: 0 hours 15 minutes 50 seconds  Total Procedure Duration: 0 hours 19 minutes 25 seconds  Findings:                 The perianal and digital rectal examinations were                            normal.                           A single small angiodysplastic lesion was found in                            the cecum.                           A 3 mm polyp was found in the hepatic flexure. The                            polyp was sessile. The polyp was removed with a                            cold snare. Resection and retrieval were complete.  A 2 to 3 mm polyp was found in the sigmoid colon.                            The polyp was sessile. The polyp was removed with a                            cold snare. Resection and retrieval were complete.                           A few small-mouthed diverticula were found in the                            sigmoid colon.                           Internal hemorrhoids were found during                            retroflexion. The hemorrhoids were small.                           The exam was otherwise without abnormality. Lavage                            was needed to clear residual liquid stool but                            adequate views obtained. Complications:            No immediate complications. Estimated blood loss:                            Minimal. Estimated Blood Loss:     Estimated blood loss was minimal. Impression:               - A single colonic angiodysplastic  lesion.                           - One 3 mm polyp at the hepatic flexure, removed                            with a cold snare. Resected and retrieved.                           - One 2 to 3 mm polyp in the sigmoid colon, removed                            with a cold snare. Resected and retrieved.                           - Diverticulosis in the sigmoid colon.                           - Internal hemorrhoids.                           -  The examination was otherwise normal. Recommendation:           - Patient has a contact number available for                            emergencies. The signs and symptoms of potential                            delayed complications were discussed with the                            patient. Return to normal activities tomorrow.                            Written discharge instructions were provided to the                            patient.                           - Resume previous diet.                           - Continue present medications.                           - Await pathology results. Viviann Spare P. Gredmarie Delange, MD 04/23/2023 1:54:08 PM This report has been signed electronically.

## 2023-04-23 NOTE — Progress Notes (Signed)
Sedate, gd SR, tolerated procedure well, VSS, report to RN 

## 2023-04-23 NOTE — Progress Notes (Signed)
Pt's states no medical or surgical changes since previsit or office visit. 

## 2023-04-23 NOTE — Patient Instructions (Signed)
Handouts given on polyps, hemorrhoids and diverticulosis.  YOU HAD AN ENDOSCOPIC PROCEDURE TODAY AT THE [facility]:   Refer to the procedure report that was given to you for any specific questions about what was found during the examination.  If the procedure report does not answer your questions, please call your gastroenterologist to clarify.  If you requested that your care partner not be given the details of your procedure findings, then the procedure report has been included in a sealed envelope for you to review at your convenience later.  YOU SHOULD EXPECT: Some feelings of bloating in the abdomen. Passage of more gas than usual.  Walking can help get rid of the air that was put into your GI tract during the procedure and reduce the bloating. If you had a lower endoscopy (such as a colonoscopy or flexible sigmoidoscopy) you may notice spotting of blood in your stool or on the toilet paper. If you underwent a bowel prep for your procedure, you may not have a normal bowel movement for a few days.  Please Note:  You might notice some irritation and congestion in your nose or some drainage.  This is from the oxygen used during your procedure.  There is no need for concern and it should clear up in a day or so.  SYMPTOMS TO REPORT IMMEDIATELY:  Following lower endoscopy (colonoscopy or flexible sigmoidoscopy):  Excessive amounts of blood in the stool  Significant tenderness or worsening of abdominal pains  Swelling of the abdomen that is new, acute  Fever of 100F or higher  For urgent or emergent issues, a gastroenterologist can be reached at any hour by calling [phone]. Do not use MyChart messaging for urgent concerns.    DIET:  We do recommend a small meal at first, but then you may proceed to your regular diet.  Drink plenty of fluids but you should avoid alcoholic beverages for 24 hours.  ACTIVITY:  You should plan to take it easy for the rest of today and you should  NOT DRIVE or use heavy machinery until tomorrow (because of the sedation medicines used during the test).    FOLLOW UP: Our staff will call the number listed on your records the next business day following your procedure.  We will call around 7:15- 8:00 am to check on you and address any questions or concerns that you may have regarding the information given to you following your procedure. If we do not reach you, we will leave a message.     If any biopsies were taken you will be contacted by phone or by letter within the next 1-3 weeks.  Please call us at [phone] if you have not heard about the biopsies in 3 weeks.    SIGNATURES/CONFIDENTIALITY: You and/or your care partner have signed paperwork which will be entered into your electronic medical record.  These signatures attest to the fact that that the information above on your After Visit Summary has been reviewed and is understood.  Full responsibility of the confidentiality of this discharge information lies with you and/or your care-partner. 

## 2023-04-23 NOTE — Progress Notes (Signed)
[facility] History and Physical   Primary Care Physician:  Eustaquio Boyden, MD   Reason for Procedure:   Colon cancer screening  Plan:    colonoscopy     HPI: Stetson Denke is a 62 y.o. male  here for colonoscopy screening - last exam 2014 - normal.   Patient denies any bowel symptoms at this time. No family history of colon cancer known. Otherwise feels well without any cardiopulmonary symptoms.   I have discussed risks / benefits of anesthesia and endoscopic procedure with [place] and they wish to proceed with the exams as outlined today.    Past Medical History:  Diagnosis Date   Allergy    Arthritis    hands   Complication of anesthesia    COVID-19 virus infection 02/18/2021   History of kidney stones    Hypertension    Kidney stone 2012   Plantar fasciitis    left foot   PONV (postoperative nausea and vomiting)     Past Surgical History:  Procedure Laterality Date   APPENDECTOMY  1991   COLONOSCOPY  2014   WNL Arlyce Dice)   CYSTOSCOPY/URETEROSCOPY/HOLMIUM LASER/STENT PLACEMENT Left 07/04/2019   Procedure: CYSTOSCOPY/RETROGRADE/URETEROSCOPY/HOLMIUM LASER/STENT PLACEMENT;  Surgeon: Heloise Purpura, MD;  Location: [facility];  Service: Urology;  Laterality: Left;   HOLMIUM LASER APPLICATION Left 07/04/2019   Procedure: HOLMIUM LASER APPLICATION;  Surgeon: Heloise Purpura, MD;  Location: [facility];  Service: Urology;  Laterality: Left;   KIDNEY STONE SURGERY  2012   LIPOMA EXCISION  2004   abdominal wall LEFT   RHINOPLASTY  1977   TENDON REPAIR Left 2002   ankle   VARICOCELECTOMY Left 1998   scrotal surgery    Prior to Admission medications   Medication Sig Start Date End Date Taking? Authorizing Provider  amLODipine (NORVASC) 5 MG tablet Take 1 tablet (5 mg total) by mouth daily. 02/24/23  Yes Eustaquio Boyden, MD  Multiple Vitamin (MULTIVITAMIN) tablet Take 1 tablet by mouth daily.   Yes [provider]    Current Outpatient Medications   Medication Sig Dispense Refill   amLODipine (NORVASC) 5 MG tablet Take 1 tablet (5 mg total) by mouth daily. 90 tablet 4   Multiple Vitamin (MULTIVITAMIN) tablet Take 1 tablet by mouth daily.     Current Facility-Administered Medications  Medication Dose Route Frequency Provider Last Rate Last Admin   0.9 %  sodium chloride infusion  500 mL Intravenous Once Oneil Behney, Willaim Rayas, MD        Allergies as of 04/23/2023 - Review Complete 04/23/2023  Allergen Reaction Noted   Ethrane [enflurane] Other (See Comments) 07/04/2019   Shrimp [shellfish allergy] Shortness Of Breath and Swelling 12/23/2012   Sulfa antibiotics Rash 12/23/2012    Family History  Problem Relation Age of Onset   Hypertension Mother    Arthritis Mother    High Cholesterol Mother    Aortic aneurysm Father 21       ruptured aorta   Alcohol abuse Father    Colon cancer Neg Hx    Colon polyps Neg Hx    Esophageal cancer Neg Hx    Rectal cancer Neg Hx    Stomach cancer Neg Hx     Social History   Socioeconomic History   Marital status: Married    Spouse name: Not on file   Number of children: Not on file   Years of education: Not on file   Highest education level: Not on file  Occupational History  Occupation: drafting    Comment: Financial planner  Tobacco Use   Smoking status: Never   Smokeless tobacco: Never  Substance and Sexual Activity   Alcohol use: Yes    Alcohol/week: 5.0 standard drinks of alcohol    Types: 3 Cans of beer, 2 Standard drinks or equivalent per week    Comment: occasional   Drug use: Never   Sexual activity: Yes    Partners: Female  Other Topics Concern   Not on file  Social History Narrative   From [place]   Brother is Development worker, international aid in [place]    Lives with wife, son (2004)    Occ: Automotive engineer, Doctor, 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  Physical Exam: Vital signs BP (!) 158/90   Pulse 65   Temp 99 F (37.2 C)   Ht 5\' 5"  (1.651 m)   Wt 170 lb (77.1 kg)   SpO2 98%   BMI 28.29 kg/m   General:   Alert,  Well-developed, pleasant and cooperative in NAD Lungs:  Clear throughout to auscultation.   Heart:  Regular rate and rhythm Abdomen:  Soft, nontender and nondistended.   Neuro/Psych:  Alert and cooperative. Normal mood and affect. A and O x 3  Harlin Rain, MD [facility]

## 2023-04-23 NOTE — Progress Notes (Signed)
Called to room to assist during endoscopic procedure.  Patient ID and intended procedure confirmed with present staff. Received instructions for my participation in the procedure from the performing physician.  

## 2023-04-24 ENCOUNTER — Telehealth: Payer: Self-pay | Admitting: *Deleted

## 2023-04-24 ENCOUNTER — Other Ambulatory Visit (INDEPENDENT_AMBULATORY_CARE_PROVIDER_SITE_OTHER): Payer: BC Managed Care – PPO

## 2023-04-24 DIAGNOSIS — E291 Testicular hypofunction: Secondary | ICD-10-CM

## 2023-04-24 DIAGNOSIS — R7989 Other specified abnormal findings of blood chemistry: Secondary | ICD-10-CM

## 2023-04-24 LAB — CBC WITH DIFFERENTIAL/PLATELET
Basophils Absolute: 0 10*3/uL (ref 0.0–0.1)
Basophils Relative: 0.5 % (ref 0.0–3.0)
Eosinophils Absolute: 0.3 10*3/uL (ref 0.0–0.7)
Eosinophils Relative: 4.5 % (ref 0.0–5.0)
HCT: 50.1 % (ref 39.0–52.0)
Hemoglobin: 16.9 g/dL (ref 13.0–17.0)
Lymphocytes Relative: 17 % (ref 12.0–46.0)
Lymphs Abs: 1.2 10*3/uL (ref 0.7–4.0)
MCHC: 33.8 g/dL (ref 30.0–36.0)
MCV: 93.8 fL (ref 78.0–100.0)
Monocytes Absolute: 0.3 10*3/uL (ref 0.1–1.0)
Monocytes Relative: 4.3 % (ref 3.0–12.0)
Neutro Abs: 5.2 10*3/uL (ref 1.4–7.7)
Neutrophils Relative %: 73.7 % (ref 43.0–77.0)
Platelets: 299 10*3/uL (ref 150.0–400.0)
RBC: 5.34 Mil/uL (ref 4.22–5.81)
RDW: 14 % (ref 11.5–15.5)
WBC: 7.1 10*3/uL (ref 4.0–10.5)

## 2023-04-24 LAB — IBC PANEL
Iron: 59 ug/dL (ref 42–165)
Saturation Ratios: 16 % — ABNORMAL LOW (ref 20.0–50.0)
TIBC: 369.6 ug/dL (ref 250.0–450.0)
Transferrin: 264 mg/dL (ref 212.0–360.0)

## 2023-04-24 LAB — FOLLICLE STIMULATING HORMONE: FSH: 4.4 m[IU]/mL (ref 1.4–18.1)

## 2023-04-24 LAB — TSH: TSH: 0.94 u[IU]/mL (ref 0.35–5.50)

## 2023-04-24 LAB — LUTEINIZING HORMONE: LH: 2.42 m[IU]/mL (ref 1.50–9.30)

## 2023-04-24 LAB — T4, FREE: Free T4: 0.91 ng/dL (ref 0.60–1.60)

## 2023-04-24 NOTE — Telephone Encounter (Signed)
Post procedure follow up phone call. No answer at number given.  Left message on voicemail.  

## 2023-04-25 ENCOUNTER — Encounter: Payer: Self-pay | Admitting: Family Medicine

## 2023-04-25 LAB — CORTISOL-AM, BLOOD: Cortisol - AM: 8.7 ug/dL

## 2023-04-25 LAB — TESTOSTERONE TOTAL,FREE,BIO, MALES
Albumin: 4.5 g/dL (ref 3.6–5.1)
Sex Hormone Binding: 49 nmol/L (ref 22–77)
Testosterone, Bioavailable: 140.8 ng/dL (ref 110.0–575.0)
Testosterone, Free: 68.5 pg/mL (ref 46.0–224.0)
Testosterone: 683 ng/dL (ref 250–827)

## 2023-04-25 LAB — PROLACTIN: Prolactin: 5 ng/mL (ref 2.0–18.0)

## 2023-04-30 LAB — SURGICAL PATHOLOGY

## 2023-07-19 DIAGNOSIS — Z23 Encounter for immunization: Secondary | ICD-10-CM | POA: Diagnosis not present

## 2023-07-19 DIAGNOSIS — W228XXA Striking against or struck by other objects, initial encounter: Secondary | ICD-10-CM | POA: Diagnosis not present

## 2023-07-19 DIAGNOSIS — S81812A Laceration without foreign body, left lower leg, initial encounter: Secondary | ICD-10-CM | POA: Diagnosis not present

## 2024-02-29 ENCOUNTER — Telehealth: Payer: Self-pay | Admitting: Family Medicine

## 2024-02-29 ENCOUNTER — Ambulatory Visit: Payer: BC Managed Care – PPO | Admitting: Family Medicine

## 2024-02-29 ENCOUNTER — Encounter: Payer: Self-pay | Admitting: Family Medicine

## 2024-02-29 VITALS — BP 138/68 | HR 70 | Temp 98.1°F | Ht 65.0 in | Wt 166.0 lb

## 2024-02-29 DIAGNOSIS — Z125 Encounter for screening for malignant neoplasm of prostate: Secondary | ICD-10-CM | POA: Diagnosis not present

## 2024-02-29 DIAGNOSIS — Z23 Encounter for immunization: Secondary | ICD-10-CM | POA: Diagnosis not present

## 2024-02-29 DIAGNOSIS — I10 Essential (primary) hypertension: Secondary | ICD-10-CM

## 2024-02-29 DIAGNOSIS — K760 Fatty (change of) liver, not elsewhere classified: Secondary | ICD-10-CM

## 2024-02-29 DIAGNOSIS — Z0000 Encounter for general adult medical examination without abnormal findings: Secondary | ICD-10-CM | POA: Diagnosis not present

## 2024-02-29 DIAGNOSIS — R0981 Nasal congestion: Secondary | ICD-10-CM

## 2024-02-29 DIAGNOSIS — R011 Cardiac murmur, unspecified: Secondary | ICD-10-CM

## 2024-02-29 DIAGNOSIS — Z7189 Other specified counseling: Secondary | ICD-10-CM

## 2024-02-29 LAB — MICROALBUMIN / CREATININE URINE RATIO
Creatinine,U: 91.8 mg/dL
Microalb Creat Ratio: 28.8 mg/g (ref 0.0–30.0)
Microalb, Ur: 2.6 mg/dL — ABNORMAL HIGH (ref 0.0–1.9)

## 2024-02-29 LAB — COMPREHENSIVE METABOLIC PANEL WITH GFR
ALT: 24 U/L (ref 0–53)
AST: 26 U/L (ref 0–37)
Albumin: 4.6 g/dL (ref 3.5–5.2)
Alkaline Phosphatase: 70 U/L (ref 39–117)
BUN: 14 mg/dL (ref 6–23)
CO2: 27 meq/L (ref 19–32)
Calcium: 9.1 mg/dL (ref 8.4–10.5)
Chloride: 101 meq/L (ref 96–112)
Creatinine, Ser: 0.74 mg/dL (ref 0.40–1.50)
GFR: 95.35 mL/min (ref >60.00)
Glucose, Bld: 91 mg/dL (ref 70–99)
Potassium: 3.8 meq/L (ref 3.5–5.1)
Sodium: 137 meq/L (ref 135–145)
Total Bilirubin: 0.8 mg/dL (ref 0.2–1.2)
Total Protein: 6.8 g/dL (ref 6.0–8.3)

## 2024-02-29 LAB — URINALYSIS, ROUTINE W REFLEX MICROSCOPIC
Bilirubin Urine: NEGATIVE
Hgb urine dipstick: NEGATIVE
Ketones, ur: NEGATIVE
Leukocytes,Ua: NEGATIVE
Nitrite: NEGATIVE
RBC / HPF: NONE SEEN (ref 0–?)
Specific Gravity, Urine: 1.015 (ref 1.000–1.030)
Total Protein, Urine: NEGATIVE
Urine Glucose: NEGATIVE
Urobilinogen, UA: 0.2 (ref 0.0–1.0)
WBC, UA: NONE SEEN (ref 0–?)
pH: 7 (ref 5.0–8.0)

## 2024-02-29 LAB — LIPID PANEL
Cholesterol: 189 mg/dL (ref 0–200)
HDL: 45.6 mg/dL (ref >39.00)
LDL Cholesterol: 124 mg/dL — ABNORMAL HIGH (ref 0–99)
NonHDL: 143.16
Total CHOL/HDL Ratio: 4
Triglycerides: 96 mg/dL (ref 0.0–149.0)
VLDL: 19.2 mg/dL (ref 0.0–40.0)

## 2024-02-29 LAB — PSA: PSA: 1.42 ng/mL (ref 0.10–4.00)

## 2024-02-29 MED ORDER — AMLODIPINE BESYLATE 5 MG PO TABS: 5.0000 mg | ORAL_TABLET | Freq: Every day | ORAL | 3 refills | Status: AC

## 2024-02-29 NOTE — Assessment & Plan Note (Addendum)
 Mild systolic, stable and pt asxs.

## 2024-02-29 NOTE — Telephone Encounter (Signed)
 Pt with h/o complex meniscal tear 2022 treated conservatively as he declined surgery and has done well. He states previous viscosupplementation shot x1 was beneficial >3 yrs ago. He is interested in evaluation  for rpt viscosupplementation injection. I asked him to schedule appt with Dr Watt but will forward to him and Arland as fyi.

## 2024-02-29 NOTE — Assessment & Plan Note (Signed)
 Chronic, stable on current regimen - takes about 3/4 pill daily.

## 2024-02-29 NOTE — Progress Notes (Signed)
 Subjective:    John Wall is a 63 y.o. male who presents for a Welcome to Medicare exam.   Cardiac Risk Factors include: advanced age (>31men, >43 women);hypertension;male gender Father with thoracic AAA rupture age 108 (significant alcohol use).   Chronic nasal congestion present for 6 months. Tried and failed multiple remedies including flonase , oral antihistamine. Declines further Rx treatment. Requests ENT evaluation. No fever, facial pain, headache.   Preventative: COLONOSCOPY 04/2023 - polyps, 1 colonic AVM, diverticulosis, int hem ([facility]) Prostate cancer screen - checked yearly, nocturia x2-3. No fmhx  Lung cancer screening - not eligible  Flu shot - declines - bad reaction to this 1996 and 2000  COVID vaccine - Pfizer 07/2019, 08/2019, booster 05/2020 Tdap 07/2014, 07/2023 Prevnar-20 today  Shingrix - 12/2019, 03/2020 Advanced directive discussion - discussed. Doesn't have set up yet. Wife would be HCPOA then children. Advanced directive packet provided today.  Seat belt use discussed  Sunscreen use discussed. No changing moles on skin.  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  Medications Outpatient Encounter Medications as of 02/29/2024  Medication Sig   amLODipine  (NORVASC ) 5 MG tablet Take 1 tablet (5 mg total) by mouth daily.   Multiple Vitamin (MULTIVITAMIN) tablet Take 1 tablet by mouth daily.   [DISCONTINUED]  amLODipine  (NORVASC ) 5 MG tablet Take 1 tablet (5 mg total) by mouth daily.   No facility-administered encounter medications on file as of 02/29/2024.     History: Past Medical History:  Diagnosis Date   Allergy    Arthritis    hands   Complication of anesthesia    COVID-19 virus infection 02/18/2021   History of kidney stones    Hypertension    Kidney stone 2012   Plantar fasciitis    left foot   PONV (postoperative nausea and vomiting)    Past Surgical History:  Procedure Laterality Date   APPENDECTOMY  1991   COLONOSCOPY  2014   WNL [place])   COLONOSCOPY  04/2023   polyps, 1 colonic AVM, diverticulosis, int hem ([facility])   CYSTOSCOPY/URETEROSCOPY/HOLMIUM LASER/STENT PLACEMENT Left 07/04/2019   Procedure: CYSTOSCOPY/RETROGRADE/URETEROSCOPY/HOLMIUM LASER/STENT PLACEMENT;  Surgeon: Renda Glance, MD;  Location: [facility];  Service: Urology;  Laterality: Left;   HOLMIUM LASER APPLICATION Left 07/04/2019   Procedure: HOLMIUM LASER APPLICATION;  Surgeon: Renda Glance, MD;  Location: [facility];  Service: Urology;  Laterality: Left;   KIDNEY STONE SURGERY  2012   LIPOMA EXCISION  2004   abdominal wall LEFT   RHINOPLASTY  1977   TENDON REPAIR Left 2002   ankle   VARICOCELECTOMY Left 1998   scrotal surgery    Family History  Problem Relation Age of Onset   Hypertension Mother    Arthritis Mother    High Cholesterol Mother    Aortic aneurysm Father 73       ruptured  aorta   Alcohol abuse Father    Colon cancer Neg Hx    Colon polyps Neg Hx    Esophageal cancer Neg Hx    Rectal cancer Neg Hx    Stomach cancer Neg Hx    Social History   Occupational History   Occupation: doctor, hospital    Comment: financial planner  Tobacco Use   Smoking status: Never   Smokeless tobacco: Never  Substance and Sexual Activity   Alcohol use: Yes    Alcohol/week: 5.0 standard drinks of alcohol    Types: 3 Cans of beer, 2 Standard drinks or equivalent per week    Comment: occasional    Drug use: Never   Sexual activity: Yes    Partners: Female    Tobacco Counseling Counseling given: Not Answered   Immunizations and Health Maintenance Immunization History  Administered Date(s) Administered   PFIZER(Purple Top)SARS-COV-2 Vaccination 07/22/2019, 08/12/2019, 05/07/2020   PNEUMOCOCCAL CONJUGATE-20 02/29/2024   Tdap 07/08/2014, 07/19/2023   Zoster Recombinant(Shingrix) 12/14/2019, 03/15/2020   Health Maintenance Due  Topic Date Due   COVID-19 Vaccine (4 - 2025-26 season) 01/04/2024    Activities of Daily Living    02/29/2024    9:36 AM  In your present state of health, do you have any difficulty performing the following activities:  Hearing? 1  Vision? 1  Difficulty concentrating or making decisions? 1  Walking or climbing stairs? 1  Dressing or bathing? 1  Doing errands, shopping? 1  Preparing Food and eating ? N  Using the Toilet? N  In the past six months, have you accidently leaked urine? N  Do you have problems with loss of bowel control? N  Managing your Medications? N  Managing your Finances? N  Housekeeping or managing your Housekeeping? N    Physical Exam   Physical Exam Vitals and nursing note reviewed.  Constitutional:      Appearance: Normal appearance. He is not ill-appearing.  HENT:     Nose: Congestion present.     Right Turbinates: Enlarged. Not swollen.     Left Turbinates: Enlarged and swollen.     Right Sinus: No maxillary sinus tenderness or frontal sinus tenderness.     Left Sinus: No maxillary sinus tenderness or frontal sinus tenderness.     Comments: Marked nasal edema with obsturction Cardiovascular:     Rate and Rhythm: Normal rate and regular rhythm.     Pulses: Normal pulses.     Heart sounds: Murmur (2/6 systolic USB) heard.  Pulmonary:     Effort: Pulmonary effort is normal. No respiratory distress.     Breath sounds: Normal breath sounds. No wheezing, rhonchi or rales.  Musculoskeletal:     Right lower leg: No  edema.     Left lower leg: No edema.  Skin:    General: Skin is warm and dry.     Findings: No rash.  Neurological:     Mental Status: He is alert.  Psychiatric:        Mood and Affect: Mood normal.        Behavior: Behavior normal.    (optional), or other factors deemed appropriate based on the beneficiary's medical and social history and current clinical standards.   Advanced Directives: Advanced directive discussion - discussed. Doesn't have set up yet. Wife would be HCPOA then children. Advanced directive packet provided today.      EKG:  Sinus bradycardia with normal axis, intervals, no hypertrophy or acute ST/T changes     Assessment:  This is a routine wellness  examination for this patient : Welcome to medicare visit  Vision/Hearing screen Hearing Screening   500Hz  1000Hz  2000Hz  4000Hz   Right ear 40 40 20 40  Left ear 40 40 20 40   Vision Screening   Right eye Left eye Both eyes  Without correction 20/70 20/70 20/50   With correction        Goals   None      Depression Screen    02/29/2024    8:28 AM 02/24/2023    8:36 AM 02/19/2022    8:01 AM 02/18/2021    8:21 AM  PHQ 2/9 Scores  PHQ - 2 Score 0 0 0 0  PHQ- 9 Score 0        Fall Risk    02/29/2024    9:26 AM  Fall Risk   Falls in the past year? 0  Number falls in past yr: 0  Injury with Fall? 0  Risk for fall due to : No Fall Risks  Follow up Falls evaluation completed    Cognitive Function        02/29/2024    9:37 AM  6CIT Screen  What Year? 0 points  What month? 0 points  What time? 0 points  Count back from 20 0 points  Months in reverse 0 points  Repeat phrase 0 points  Total Score 0 points    Patient Care Team: Rilla Baller, MD as PCP - General (Family Medicine)     Plan:     I have personally reviewed and noted the following in the patient's chart:   Medical and social history Use of alcohol, tobacco or illicit drugs  Current medications and  supplements including opioid prescriptions. Patient is not currently taking opioid prescriptions. Functional ability and status Nutritional status Physical activity Advanced directives List of other physicians Hospitalizations, surgeries, and ER visits in previous 12 months Vitals Screenings to include cognitive, depression, and falls Referrals and appointments  In addition, I have reviewed and discussed with patient certain preventive protocols, quality metrics, and best practice recommendations. A written personalized care plan for preventive services as well as general preventive health recommendations were provided to patient.   Lerone was seen today for marriott.  Welcome to Medicare preventive visit Assessment & Plan: I have personally reviewed the Medicare Annual Wellness questionnaire and have noted 1. The patient's medical and social history 2. Their use of alcohol, tobacco or illicit drugs 3. Their current medications and supplements 4. The patient's functional ability including ADL's, fall risks, home safety risks and hearing or visual impairment. Cognitive function has been assessed and addressed as indicated.  5. Diet and physical activity 6. Evidence for depression or mood disorders The patients weight, height, BMI have been recorded in the chart. I have made referrals, counseling and provided education to the patient based on review of the above and I have provided the pt with a written personalized care plan for preventive services. Provider list updated.. See scanned questionairre as needed for further documentation. Reviewed preventative protocols and updated unless pt declined.    Advanced directives, counseling/discussion Assessment & Plan: Discussed. Doesn't have set up yet. Wife would be HCPOA then children. Advanced directive packet provided today    NAFLD (nonalcoholic fatty liver disease) Overview: abd US  with hepatic steatosis  01/2022  Assessment & Plan: Update LFTs.    Primary hypertension Assessment & Plan: Chronic, stable on current regimen - takes about 3/4 pill daily.   Orders: -  amLODIPine  Besylate; Take 1 tablet (5 mg total) by mouth daily.  Dispense: 90 tablet; Refill: 3 -     Lipid panel -     Comprehensive metabolic panel with GFR -     Urinalysis, Routine w reflex microscopic -     Microalbumin / creatinine urine ratio  Special screening for malignant neoplasm of prostate -     PSA  Chronic nasal congestion Assessment & Plan: Intolerant to previous treatments including nasal steroid (nosebleeds) and what sounds alike antihistamines (tachycardia?).  He regularly uses nasal saline. Significant swelling with obstruction on exam.  He declines further trial of medications including oral prednisone. Agrees to ENT evaluation.   Orders: -     Ambulatory referral to ENT  Encounter for Medicare annual wellness exam  Need for vaccination against Streptococcus pneumoniae -     Pneumococcal conjugate vaccine 20-valent  Murmur Overview: Possible h/o MVP remotely  Assessment & Plan: Mild systolic, stable and pt asxs.         Patient Instructions  Labor[facility] Prevnar-20 today Lo remitire a otorrhinolaryngologo en [place]  para evaluar congestion nasal cronica.  Gusto verlo you - regresar en 1 ao para proximo examen fisico/medicare   Mr. Massi , Thank you for taking time to come for your Medicare Wellness Visit. I appreciate your ongoing commitment to your health goals. Please review the following plan we discussed and let me know if I can assist you in the future.   These are the goals we discussed:  Goals   None     This is a list of the screening recommended for you and due dates:  Health Maintenance  Topic Date Due   Medicare Annual Wellness Visit  Never done   Pneumococcal Vaccine for age over 31 (1 of 1 - PCV) Never done   COVID-19 Vaccine (4 - 2025-26 season)  01/04/2024   Flu Shot  08/02/2024*   Colon Cancer Screening  04/22/2030   DTaP/Tdap/Td vaccine (3 - Td or Tdap) 07/18/2033   Hepatitis C Screening  Completed   HIV Screening  Completed   Zoster (Shingles) Vaccine  Completed   Hepatitis B Vaccine  Aged Out   Meningitis B Vaccine  Aged Out  *Topic was postponed. The date shown is not the original due date.     Anton Blas, MD 02/29/2024

## 2024-02-29 NOTE — Assessment & Plan Note (Signed)
 Update LFT's

## 2024-02-29 NOTE — Telephone Encounter (Signed)
 It should be for the left knee.  Chart reviewed.

## 2024-02-29 NOTE — Assessment & Plan Note (Signed)
 Intolerant to previous treatments including nasal steroid (nosebleeds) and what sounds alike antihistamines (tachycardia?).  He regularly uses nasal saline. Significant swelling with obstruction on exam.  He declines further trial of medications including oral prednisone. Agrees to ENT evaluation.

## 2024-02-29 NOTE — Telephone Encounter (Signed)
 Benefit coverage for Left knee Durolane gel injection requested.  Will reach out to provider once coverage has been determined.

## 2024-02-29 NOTE — Telephone Encounter (Signed)
 Patient has a fully funded [employer] plus PPO plan.  The plan covers 80% of the allowable amount for Durolane and 100% of the allowable amount for the procedure.  When out of pocket is met, coverage goes to 100% and copay will no longer apply.  Deductibles do not apply these services.  Patient has a $30 copay   As of 10.1.24 [employer] Medicare no longer require a pre-cert for the drug Durolane  **Medical notes may be requested upon the time of claim processing**  Benefit summary has been placed in Providers boxes for review

## 2024-02-29 NOTE — Patient Instructions (Addendum)
 [facility] Prevnar-20 today Lo remitire a Location Manager  para evaluar congestion nasal cronica.  Gusto verlo you - regresar en 1 ao para proximo examen fisico/medicare   John Wall , Thank you for taking time to come for your Medicare Wellness Visit. I appreciate your ongoing commitment to your health goals. Please review the following plan we discussed and let me know if I can assist you in the future.   These are the goals we discussed:  Goals   None     This is a list of the screening recommended for you and due dates:  Health Maintenance  Topic Date Due   Medicare Annual Wellness Visit  Never done   Pneumococcal Vaccine for age over 34 (1 of 1 - PCV) Never done   COVID-19 Vaccine (4 - 2025-26 season) 01/04/2024   Flu Shot  08/02/2024*   Colon Cancer Screening  04/22/2030   DTaP/Tdap/Td vaccine (3 - Td or Tdap) 07/18/2033   Hepatitis C Screening  Completed   HIV Screening  Completed   Zoster (Shingles) Vaccine  Completed   Hepatitis B Vaccine  Aged Out   Meningitis B Vaccine  Aged Out  *Topic was postponed. The date shown is not the original due date.

## 2024-02-29 NOTE — Telephone Encounter (Signed)
 OK, let's run his visco benefits before we make him an appt.  Thanks!

## 2024-02-29 NOTE — Assessment & Plan Note (Signed)

## 2024-02-29 NOTE — Assessment & Plan Note (Signed)
 Discussed. Doesn't have set up yet. Wife would be HCPOA then children. Advanced directive packet provided today

## 2024-02-29 NOTE — Telephone Encounter (Signed)
 Thanks

## 2024-02-29 NOTE — Telephone Encounter (Signed)
 Would you let the patient know that information?  If he would like to proceed, we can just set him up for a Durolane injection with me.  I can't remember the exact cost of Durolane, but he would be responsible for 20% medication costs only.  Rocky, do you remember the cost?

## 2024-03-01 NOTE — Telephone Encounter (Signed)
 Durolane is $2100 so patient would be responsible to $420.

## 2024-03-01 NOTE — Telephone Encounter (Signed)
 Left message for John Wall to return call to office. I will also sent patient a MyChart message.

## 2024-03-02 ENCOUNTER — Telehealth: Payer: Self-pay | Admitting: Family Medicine

## 2024-03-02 NOTE — Telephone Encounter (Signed)
 Patient called returning a call from Nurse Arland about the payment for the knee injection. Transferred the patient to the office.

## 2024-03-02 NOTE — Telephone Encounter (Signed)
 Copied from CRM [id number]. Topic: General - Other >> Mar 02, 2024  1:04 PM John Wall wrote: Reason for CRM: Patient would like to speak with Arland about the payment for the knee injection. Relayed MyChart message, but patient still has a few questions. Please call patient and advise.

## 2024-03-02 NOTE — Telephone Encounter (Signed)
 Spoke with Mr. Werts and scheduled him on 11//03/25 at 4:00 pm for a Durolane Injection.

## 2024-03-03 NOTE — Addendum Note (Signed)
 Addended by: SEBASTIAN DANNA GRADE on: 03/03/2024 05:14 PM   Modules accepted: Orders

## 2024-03-04 ENCOUNTER — Ambulatory Visit: Payer: Self-pay | Admitting: Family Medicine

## 2024-03-07 ENCOUNTER — Ambulatory Visit: Admitting: Family Medicine

## 2024-03-07 ENCOUNTER — Encounter: Payer: Self-pay | Admitting: Family Medicine

## 2024-03-07 VITALS — BP 130/70 | HR 74 | Temp 98.5°F | Ht 65.0 in | Wt 169.5 lb

## 2024-03-07 DIAGNOSIS — M1712 Unilateral primary osteoarthritis, left knee: Secondary | ICD-10-CM

## 2024-03-07 MED ORDER — SODIUM HYALURONATE 60 MG/3ML IX PRSY
60.0000 mg | PREFILLED_SYRINGE | Freq: Once | INTRA_ARTICULAR | Status: AC
  Administered 2024-03-07: 60 mg via INTRA_ARTICULAR

## 2024-03-07 NOTE — Progress Notes (Unsigned)
 Felissa Blouch T. Bernon Arviso, MD, CAQ Sports Medicine [facility] at [facility] [address] [place], [address]  Phone: [phone]  FAX: [phone]  Orlin Kann - 63 y.o. male  MRN [id number]  Date of Birth: 02/23/1961  Date: 03/07/2024  PCP: Rilla Baller, MD  Referral: Rilla Baller, MD  Chief Complaint  Patient presents with  . Medical Management of Chronic Issues    Here for Durolane inj in L knee.    Subjective:   John Wall is a 63 y.o. very pleasant male patient with Body mass index is 28.21 kg/m. who presents with the following:  Discussed the use of AI scribe software for clinical note transcription with the patient, who gave verbal consent to proceed.  He presents referred by his primary care doctor for consideration of viscosupplementation injection of the left knee.  Patient also has quite a number of questions regarding knee pain, arthritis, prior imaging, prior MRI findings.  He also goes over with me that he saw a physician in the [place] previously who gave him viscosupplementation with good response.  They had been considering PRP or other additional non-FDA approved therapies, which were cost prohibitive for the patient.  He has been having pain with his knee off and on for several years, and I previously saw him in 2022.  He had an excellent response to viscosupplementation on his prior injections.  He wanted to review his 2022 MRI of the knee with me again today.  He did have high-grade central trochlear chondrosis and arthritic change in the lateral compartment.  He also had a complex tear of the lateral meniscus.  There was also some pes anserine bursitis, synovitis and an effusion. - Due to his pain and dysfunction, previously I did refer him to orthopedic surgery who offered him surgery.  At that point, he was not interested in having operative intervention.  Review of Systems is noted in the HPI, as  appropriate  Objective:   BP 130/70   Pulse 74   Temp 98.5 F (36.9 C) (Oral)   Ht 5' 5 (1.651 m)   Wt 169 lb 8 oz (76.9 kg)   SpO2 95%   BMI 28.21 kg/m   GEN: No acute distress; alert,appropriate. PULM: Breathing comfortably in no respiratory distress PSYCH: Normally interactive.   Left knee: Full extension and flexion to 120 Mild effusion Stable to varus and valgus stress ACL and PCL are intact No tenderness at the tibial plateau tibial tubercle No bursitis Patellar and quadricep tendons are intact Mild pain with flexion pinch and McMurray's Mild lateral greater than medial joint line tenderness  Laboratory and Imaging Data:  Assessment and Plan:     ICD-10-CM   1. Primary osteoarthritis of left knee  M17.12 Sodium Hyaluronate (DUROLANE) intra-articular injection 60 mg     I went over her arthritis in general quite a bit, and reviewed all the patient's prior imaging with him in great detail.  Known high-grade osteoarthropathy of the lateral compartment, and he also had a prior complex lateral meniscal tear.  Good response to viscosupplementation in the past, so we will do a repeat Durolane injection today.  Aspiration/Injection Procedure Note Philopateer Strine Sep 04, 1960 Date of procedure: 03/07/2024  Procedure: Large Joint Aspiration / Injection of Knee for Viscosupplementation, L Medication: Durolane 60 mg / 3 mL Indications: Pain J7318, # 60 units  Procedure Details Patient verbally consented to procedure. Risks, benefits, and alternatives explained. Sterilely prepped with Chloraprep. Ethyl cholride used  for anesthesia, then 7 cc of Lidocaine  1% used for anesthesia in the anterolateral position. Reprepped with Chloraprep.  Anteromedial approach used to inject joint without difficulty, injected with Durolane 60 mg/3 mL. No complications with procedure and tolerated well.   Medication Management during today's office visit: Meds ordered this encounter  Medications   . Sodium Hyaluronate (DUROLANE) intra-articular injection 60 mg     Disposition: prn  [employer] speech-to-text software was used for transcription in this dictation.  Possible transcriptional errors can occur using Animal nutritionist.   Signed,  Jacques DASEN. Kalany Diekmann, MD   Outpatient Encounter Medications as of 03/07/2024  Medication Sig  . amLODipine  (NORVASC ) 5 MG tablet Take 1 tablet (5 mg total) by mouth daily.  . Multiple Vitamin (MULTIVITAMIN) tablet Take 1 tablet by mouth daily.  . [EXPIRED] Sodium Hyaluronate (DUROLANE) intra-articular injection 60 mg    No facility-administered encounter medications on file as of 03/07/2024.

## 2024-03-08 ENCOUNTER — Ambulatory Visit: Payer: Self-pay

## 2024-03-08 NOTE — Telephone Encounter (Signed)
 FYI Only or Action Required?: Action required by provider: clinical question for provider.  Patient was last seen in primary care on 03/07/2024 by John Mirza, MD.  Called Nurse Triage reporting Knee Pain.  Symptoms began yesterday.  Interventions attempted: Nothing.  Symptoms are: gradually worsening. Started having increased pain and swelling after injection in office yesterday. Has swelling as well. Declines OV. Asking what can he take for pain. Please advise pt.  Triage Disposition: See HCP Within 4 Hours (Or PCP Triage)  Patient/caregiver understands and will follow disposition?: Yes     Copied from CRM [id number]. Topic: Clinical - Red Word Triage >> Mar 08, 2024  8:18 AM Robinson H wrote: [facility] that prompted transfer to Nurse Triage: Got injection in knee yesterday today having a lot of pain hurts really bad, swelling and can't walk. Wants to know what can he do pain is a 12-15 Reason for Disposition  [1] SEVERE pain (e.g., excruciating, unable to walk) AND [2] not improved after 2 hours of pain medicine  Answer Assessment - Initial Assessment Questions 1. LOCATION and RADIATION: Where is the pain located?      Left knee 2. QUALITY: What does the pain feel like?  (e.g., sharp, dull, aching, burning)     sharp 3. SEVERITY: How bad is the pain? What does it keep you from doing?   (Scale 1-10; or mild, moderate, severe)     12 4. ONSET: When did the pain start? Does it come and go, or is it there all the time?     Yesterday after injection 5. RECURRENT: Have you had this pain before? If Yes, ask: When, and what happened then?     no 6. SETTING: Has there been any recent work, exercise or other activity that involved that part of the body?      Had injection yesterday 7. AGGRAVATING FACTORS: What makes the knee pain worse? (e.g., walking, climbing stairs, running)     walking 8. ASSOCIATED SYMPTOMS: Is there any swelling or redness of the knee?      swelling 9. OTHER SYMPTOMS: Do you have any other symptoms? (e.g., calf pain, chest pain, difficulty breathing, fever)     no 10. PREGNANCY: Is there any chance you are pregnant? When was your last menstrual period?       N/a  Protocols used: Knee Pain-A-AH

## 2024-03-09 NOTE — Telephone Encounter (Signed)
 LM for pt to return call.

## 2024-03-09 NOTE — Telephone Encounter (Signed)
 Called all numbers including emergency contact.left message on all numbers. Sent my chart message to call office as well. When patient calls back needs to talk with Elieser Tetrick or Rosina.

## 2024-03-09 NOTE — Telephone Encounter (Signed)
 I just received this message today.  Will forward request to Dr Watt who he last saw.

## 2024-03-09 NOTE — Telephone Encounter (Signed)
 OK.  Post-viscosupplementation arthritis flair can happen.  Fairly uncommon, but they do happen.  It usually resolves within 72 hours or so, but I would like to see him at any time this afternoon.  If he can come to the office, I will see him at any time.  Really up until 4:30.

## 2024-03-10 NOTE — Telephone Encounter (Addendum)
 Have called all numbers today and left messages still not called back. He did review my chart message sent on Last read by Marcella Biel at 10:39AM on 03/10/2024.

## 2024-03-22 DIAGNOSIS — J342 Deviated nasal septum: Secondary | ICD-10-CM | POA: Diagnosis not present

## 2024-03-22 DIAGNOSIS — J3489 Other specified disorders of nose and nasal sinuses: Secondary | ICD-10-CM | POA: Diagnosis not present

## 2024-03-22 DIAGNOSIS — J343 Hypertrophy of nasal turbinates: Secondary | ICD-10-CM | POA: Diagnosis not present

## 2024-04-12 ENCOUNTER — Encounter (HOSPITAL_BASED_OUTPATIENT_CLINIC_OR_DEPARTMENT_OTHER): Payer: Self-pay | Admitting: Otolaryngology

## 2024-04-12 ENCOUNTER — Other Ambulatory Visit: Payer: Self-pay

## 2024-04-14 NOTE — H&P (Signed)
 Otolaryngology Office Note  HPI:   John Wall is a 63 y.o. male who presents as a new Patient.   Referring Provider: No ref. provider found  Chief complaint: Nasal obstruction.  HPI: Long history of nasal obstruction. He is unable to take antihistamines or any other nasal steroid spray because of severe side effects. He had an injury to his nose as a child and had reconstructive surgery. He did well for a while but now is having a lot of trouble breathing. He was using other nasal sprays in the past but it has been several months since he has done that. He does get some frontal sinus type pain once in a while.  PMH/Meds/All/SocHx/FamHx/ROS:   Medical History[1]  Surgical History[2]  No family history of bleeding disorders, wound healing problems or difficulty with anesthesia.     Current Medications[3]  A complete ROS was performed with pertinent positives/negatives noted in the HPI. The remainder of the ROS are negative.   Physical Exam:   BP 132/78 (BP Location: Right arm, Patient Position: Sitting)  Pulse 73  Temp 98.7 F (37.1 C) (Temporal)  Ht 1.702 m (5' 7)  Wt 76.5 kg (168 lb 9.6 oz)  BMI 26.41 kg/m   General: Healthy and alert, in no distress, breathing easily. Normal affect. In a pleasant mood. Head: Normocephalic, atraumatic. No masses, or scars. Eyes: Pupils are equal, and reactive to light. Vision is grossly intact. No spontaneous or gaze nystagmus. Ears: Ear canals are clear. Tympanic membranes are intact, with normal landmarks and the middle ears are clear and healthy. Hearing: Grossly normal. Nose: Nasal cavities reveal very minimal nasal airways bilaterally due to septal deviation and very large turbinates. There is no evidence of polyps or exudate. Face: No masses or scars, facial nerve function is symmetric. Oral Cavity: No mucosal abnormalities are noted. Tongue with normal mobility. Dentition appears healthy. Oropharynx: Tonsils are symmetric.  There are no mucosal masses identified. Tongue base appears normal and healthy. Larynx/Hypopharynx: deferred Chest: Deferred Neck: No palpable masses, no cervical adenopathy, no thyroid  nodules or enlargement. Neuro: Cranial nerves II-XII with normal function. Balance: Normal gate. Other findings: none.  Independent Review of Additional Tests or Records:  none  Procedures:  none  Impression & Plans:  Severe nasal obstruction due to structural problems. Recommend CT imaging to rule out any chronic sinus disease that may be contributing to his symptoms. He is very likely going to require turbinate surgery and possible septorhinoplasty. We will review the CT imaging and then discuss this again.

## 2024-04-18 ENCOUNTER — Ambulatory Visit (HOSPITAL_BASED_OUTPATIENT_CLINIC_OR_DEPARTMENT_OTHER)
Admission: RE | Admit: 2024-04-18 | Discharge: 2024-04-18 | Disposition: A | Attending: Otolaryngology | Admitting: Otolaryngology

## 2024-04-18 ENCOUNTER — Ambulatory Visit (HOSPITAL_BASED_OUTPATIENT_CLINIC_OR_DEPARTMENT_OTHER)

## 2024-04-18 ENCOUNTER — Encounter (HOSPITAL_BASED_OUTPATIENT_CLINIC_OR_DEPARTMENT_OTHER): Admission: RE | Disposition: A | Payer: Self-pay | Source: Home / Self Care | Attending: Otolaryngology

## 2024-04-18 ENCOUNTER — Other Ambulatory Visit: Payer: Self-pay

## 2024-04-18 ENCOUNTER — Encounter (HOSPITAL_BASED_OUTPATIENT_CLINIC_OR_DEPARTMENT_OTHER): Payer: Self-pay | Admitting: Otolaryngology

## 2024-04-18 DIAGNOSIS — J343 Hypertrophy of nasal turbinates: Secondary | ICD-10-CM | POA: Diagnosis not present

## 2024-04-18 DIAGNOSIS — J342 Deviated nasal septum: Secondary | ICD-10-CM | POA: Insufficient documentation

## 2024-04-18 DIAGNOSIS — Z01818 Encounter for other preprocedural examination: Secondary | ICD-10-CM

## 2024-04-18 DIAGNOSIS — J3489 Other specified disorders of nose and nasal sinuses: Secondary | ICD-10-CM | POA: Diagnosis not present

## 2024-04-18 DIAGNOSIS — I10 Essential (primary) hypertension: Secondary | ICD-10-CM | POA: Insufficient documentation

## 2024-04-18 DIAGNOSIS — Z79899 Other long term (current) drug therapy: Secondary | ICD-10-CM | POA: Diagnosis not present

## 2024-04-18 HISTORY — PX: NASAL SEPTOPLASTY W/ TURBINOPLASTY: SHX2070

## 2024-04-18 SURGERY — SEPTOPLASTY, NOSE, WITH NASAL TURBINATE REDUCTION
Anesthesia: General | Site: Nose | Laterality: Bilateral

## 2024-04-18 MED ORDER — PROPOFOL 10 MG/ML IV BOLUS
INTRAVENOUS | Status: DC | PRN
  Administered 2024-04-18: 08:00:00 170 mg via INTRAVENOUS

## 2024-04-18 MED ORDER — PROPOFOL 10 MG/ML IV BOLUS
INTRAVENOUS | Status: AC
  Filled 2024-04-18: qty 20

## 2024-04-18 MED ORDER — OXYMETAZOLINE HCL 0.05 % NA SOLN
NASAL | Status: DC | PRN
  Administered 2024-04-18: 08:00:00 1 via TOPICAL

## 2024-04-18 MED ORDER — ROCURONIUM BROMIDE 100 MG/10ML IV SOLN
INTRAVENOUS | Status: DC | PRN
  Administered 2024-04-18: 08:00:00 60 mg via INTRAVENOUS

## 2024-04-18 MED ORDER — LIDOCAINE HCL (CARDIAC) PF 100 MG/5ML IV SOSY
PREFILLED_SYRINGE | INTRAVENOUS | Status: DC | PRN
  Administered 2024-04-18: 08:00:00 50 mg via INTRAVENOUS

## 2024-04-18 MED ORDER — OXYMETAZOLINE HCL 0.05 % NA SOLN
2.0000 | NASAL | Status: DC
  Administered 2024-04-18: 07:00:00 2 via NASAL

## 2024-04-18 MED ORDER — MIDAZOLAM HCL 5 MG/5ML IJ SOLN
INTRAMUSCULAR | Status: DC | PRN
  Administered 2024-04-18: 08:00:00 2 mg via INTRAVENOUS

## 2024-04-18 MED ORDER — ONDANSETRON HCL 4 MG/2ML IJ SOLN
INTRAMUSCULAR | Status: AC
  Filled 2024-04-18: qty 2

## 2024-04-18 MED ORDER — PHENYLEPHRINE 80 MCG/ML (10ML) SYRINGE FOR IV PUSH (FOR BLOOD PRESSURE SUPPORT)
PREFILLED_SYRINGE | INTRAVENOUS | Status: AC
  Filled 2024-04-18: qty 10

## 2024-04-18 MED ORDER — FENTANYL CITRATE (PF) 100 MCG/2ML IJ SOLN
INTRAMUSCULAR | Status: AC
  Filled 2024-04-18: qty 2

## 2024-04-18 MED ORDER — HYDROCODONE-ACETAMINOPHEN 5-325 MG PO TABS: 1.0000 | ORAL_TABLET | Freq: Four times a day (QID) | ORAL | 0 refills | Status: AC | PRN

## 2024-04-18 MED ORDER — BACITRACIN ZINC 500 UNIT/GM EX OINT
TOPICAL_OINTMENT | CUTANEOUS | Status: DC | PRN
  Administered 2024-04-18: 08:00:00 1 via TOPICAL

## 2024-04-18 MED ORDER — LACTATED RINGERS IV SOLN: INTRAVENOUS | Status: DC

## 2024-04-18 MED ORDER — PHENYLEPHRINE HCL (PRESSORS) 10 MG/ML IV SOLN
INTRAVENOUS | Status: DC | PRN
  Administered 2024-04-18: 08:00:00 80 ug via INTRAVENOUS

## 2024-04-18 MED ORDER — LIDOCAINE 2% (20 MG/ML) 5 ML SYRINGE
INTRAMUSCULAR | Status: AC
  Filled 2024-04-18: qty 5

## 2024-04-18 MED ORDER — DEXAMETHASONE SODIUM PHOSPHATE 4 MG/ML IJ SOLN
INTRAMUSCULAR | Status: DC | PRN
  Administered 2024-04-18: 08:00:00 10 mg via INTRAVENOUS

## 2024-04-18 MED ORDER — ONDANSETRON HCL 4 MG/2ML IJ SOLN
INTRAMUSCULAR | Status: DC | PRN
  Administered 2024-04-18: 08:00:00 4 mg via INTRAVENOUS

## 2024-04-18 MED ORDER — ONDANSETRON HCL 4 MG/2ML IJ SOLN: 4.0000 mg | Freq: Once | INTRAMUSCULAR | Status: DC | PRN

## 2024-04-18 MED ORDER — AMISULPRIDE (ANTIEMETIC) 5 MG/2ML IV SOLN: 10.0000 mg | Freq: Once | INTRAVENOUS | Status: DC | PRN

## 2024-04-18 MED ORDER — OXYMETAZOLINE HCL 0.05 % NA SOLN
NASAL | Status: AC
  Filled 2024-04-18: qty 30

## 2024-04-18 MED ORDER — MIDAZOLAM HCL 2 MG/2ML IJ SOLN
INTRAMUSCULAR | Status: AC
  Filled 2024-04-18: qty 2

## 2024-04-18 MED ORDER — FENTANYL CITRATE (PF) 100 MCG/2ML IJ SOLN: 25.0000 ug | INTRAMUSCULAR | Status: DC | PRN

## 2024-04-18 MED ORDER — SUGAMMADEX SODIUM 200 MG/2ML IV SOLN
INTRAVENOUS | Status: DC | PRN
  Administered 2024-04-18: 08:00:00 200 mg via INTRAVENOUS

## 2024-04-18 MED ORDER — OXYCODONE HCL 5 MG PO TABS: 5.0000 mg | ORAL_TABLET | Freq: Once | ORAL | Status: DC | PRN

## 2024-04-18 MED ORDER — ACETAMINOPHEN 10 MG/ML IV SOLN
INTRAVENOUS | Status: AC
  Filled 2024-04-18: qty 100

## 2024-04-18 MED ORDER — FENTANYL CITRATE (PF) 100 MCG/2ML IJ SOLN
INTRAMUSCULAR | Status: DC | PRN
  Administered 2024-04-18 (×2): 50 ug via INTRAVENOUS

## 2024-04-18 MED ORDER — OXYCODONE HCL 5 MG/5ML PO SOLN: 5.0000 mg | Freq: Once | ORAL | Status: DC | PRN

## 2024-04-18 MED ORDER — ACETAMINOPHEN 10 MG/ML IV SOLN
1000.0000 mg | Freq: Once | INTRAVENOUS | Status: DC | PRN
  Administered 2024-04-18: 09:00:00 1000 mg via INTRAVENOUS

## 2024-04-18 MED ORDER — ONDANSETRON 4 MG PO TBDP: 4.0000 mg | ORAL_TABLET | Freq: Three times a day (TID) | ORAL | 0 refills | Status: AC | PRN

## 2024-04-18 MED ORDER — ROCURONIUM BROMIDE 10 MG/ML (PF) SYRINGE
PREFILLED_SYRINGE | INTRAVENOUS | Status: AC
  Filled 2024-04-18: qty 10

## 2024-04-18 MED ORDER — LIDOCAINE-EPINEPHRINE 1 %-1:100000 IJ SOLN
INTRAMUSCULAR | Status: DC | PRN
  Administered 2024-04-18: 08:00:00 6 mL

## 2024-04-18 MED ORDER — DEXMEDETOMIDINE HCL IN NACL 80 MCG/20ML IV SOLN
INTRAVENOUS | Status: AC
  Filled 2024-04-18: qty 20

## 2024-04-18 SURGICAL SUPPLY — 27 items
CANISTER SUCT 1200ML W/VALVE (MISCELLANEOUS) ×1 IMPLANT
COAGULATOR SUCT 8FR VV (MISCELLANEOUS) IMPLANT
DRSG NASOPORE 8CM (GAUZE/BANDAGES/DRESSINGS) IMPLANT
DRSG TELFA 3X8 NADH STRL (GAUZE/BANDAGES/DRESSINGS) IMPLANT
ELECTRODE REM PT RTRN 9FT ADLT (ELECTROSURGICAL) ×1 IMPLANT
GAUZE 4X4 16PLY ~~LOC~~+RFID DBL (SPONGE) IMPLANT
GAUZE SPONGE 2X2 STRL 8-PLY (GAUZE/BANDAGES/DRESSINGS) ×1 IMPLANT
GLOVE ECLIPSE 7.5 STRL STRAW (GLOVE) ×1 IMPLANT
GOWN STRL REUS W/ TWL LRG LVL3 (GOWN DISPOSABLE) ×2 IMPLANT
HEMOSTAT SURGICEL .5X2 ABSORB (HEMOSTASIS) IMPLANT
NDL PRECISIONGLIDE 27X1.5 (NEEDLE) ×1 IMPLANT
NEEDLE PRECISIONGLIDE 27X1.5 (NEEDLE) ×1 IMPLANT
PACK BASIN DAY SURGERY FS (CUSTOM PROCEDURE TRAY) ×1 IMPLANT
PACK ENT DAY SURGERY (CUSTOM PROCEDURE TRAY) ×1 IMPLANT
PATTIES SURGICAL .5 X3 (DISPOSABLE) ×1 IMPLANT
SHEET SILICONE 2X3 0.03 REINF (MISCELLANEOUS) IMPLANT
SLEEVE SCD COMPRESS KNEE MED (STOCKING) ×1 IMPLANT
SOLN 0.9% NACL POUR BTL 1000ML (IV SOLUTION) IMPLANT
SPIKE FLUID TRANSFER (MISCELLANEOUS) IMPLANT
STRIP CLOSURE SKIN 1/2X4 (GAUZE/BANDAGES/DRESSINGS) IMPLANT
SUT CHROMIC 4 0 RB 1X27 (SUTURE) ×1 IMPLANT
SUT ETHILON 3 0 PS 1 (SUTURE) IMPLANT
SUT ETHILON 4 0 CL P 3 (SUTURE) IMPLANT
SUT ETHILON 6 0 P 1 (SUTURE) IMPLANT
SUT PLAIN 4 0 ~~LOC~~ 1 (SUTURE) ×1 IMPLANT
TOWEL GREEN STERILE FF (TOWEL DISPOSABLE) ×1 IMPLANT
YANKAUER SUCT BULB TIP NO VENT (SUCTIONS) ×1 IMPLANT

## 2024-04-18 NOTE — Op Note (Signed)
 OPERATIVE REPORT  DATE OF SURGERY: 04/18/2024  PATIENT:  John Wall,  63 y.o. male  PRE-OPERATIVE DIAGNOSIS:  Deviated nasal septum Nasal turbinate hypertrophy Sinus pain  POST-OPERATIVE DIAGNOSIS:  Deviated nasal septumNasal turbinate hypertrophySinus pain  PROCEDURE:  Procedures: SEPTOPLASTY, NOSE, WITH NASAL TURBINATE REDUCTION  SURGEON:  Ida VEAR Loader, MD  ASSISTANTS: none  ANESTHESIA:   General   EBL:  40 ml  DRAINS: none  LOCAL MEDICATIONS USED:  1% Xylocaine  with epinephrine   SPECIMEN:  none  COUNTS:  Correct  PROCEDURE DETAILS: The patient was taken to the operating room and placed on the operating table in the supine position. Following induction of general endotracheal anesthesia, the nose was prepped and draped in a standard fashion. Afrin spray was used preoperatively in the holding area. 1% Xylocaine  with epinephrine  was infiltrated into the septum, the columella, and the inferior turbinates bilaterally.  1. Nasal septoplasty. A left hemitransfixion incision was created with a 15 scalpel. A mucoperichondrial flap was developed posteriorly down the left side of the nasal septum using a Cottle elevator. This was performed all the way to the sphenoid rostrum. The bony cartilaginous junction was divided and a similar flap was developed down the right side. The ethmoid plate was severely deflected to the left and was mostly resected.  There was caudal show the cartilage but without digitally positioning the tip of the nose this was not visible so it was felt not to be a significant part of the obstruction.  2. Submucous resection inferior turbinates bilaterally.  The right inferior turbinate was severely enlarged.  The left side less so.  The leading edge of the inferior turbinates were incised in a vertical fashion with a #15 scalpel. The Cottle elevator was used to elevate mucosa off bone in all directions. Fragments of the turbinate bone were resected with a Takahashi  forceps. The turbinate remnants were outfractured with the Therapist, nutritional.  All of these maneuvers greatly increased the nasal airways bilaterally. The nasal cavities were packed with rolled up Telfa coated with bacitracin  ointment. The pharynx was suctioned of blood and secretions under direct visualization. The patient was awakened extubated and transferred to recovery in stable condition.    PATIENT DISPOSITION:  To PACU, stable

## 2024-04-18 NOTE — Transfer of Care (Signed)
 Immediate Anesthesia Transfer of Care Note  Patient: John Wall  Procedure(s) Performed: SEPTOPLASTY, NOSE, WITH NASAL TURBINATE REDUCTION (Bilateral: Nose)  Patient Location: PACU  Anesthesia Type:General  Level of Consciousness: awake, alert , and oriented  Airway & Oxygen Therapy: Patient Spontanous Breathing and Patient connected to face mask oxygen  Post-op Assessment: Report given to RN and Post -op Vital signs reviewed and stable  Post vital signs: Reviewed and stable  Last Vitals:  Vitals Value Taken Time  BP 137/83 04/18/24 08:37  Temp 36.6 C 04/18/24 08:37  Pulse 73 04/18/24 08:37  Resp 14 04/18/24 08:37  SpO2 99 % 04/18/24 08:37  Vitals shown include unfiled device data.  Last Pain:  Vitals:   04/18/24 0631  TempSrc: Temporal  PainSc: 0-No pain      Patients Stated Pain Goal: 4 (04/18/24 0631)  Complications: No notable events documented.

## 2024-04-18 NOTE — Interval H&P Note (Signed)
 History and Physical Interval Note:  04/18/2024 7:21 AM  John Wall  has presented today for surgery, with the diagnosis of Deviated nasal septum Nasal turbinate hypertrophy Sinus pain.  The various methods of treatment have been discussed with the patient and family. After consideration of risks, benefits and other options for treatment, the patient has consented to  Procedures: SEPTOPLASTY, NOSE, WITH NASAL TURBINATE REDUCTION (Bilateral) as a surgical intervention.  The patient's history has been reviewed, patient examined, no change in status, stable for surgery.  I have reviewed the patient's chart and labs.  Questions were answered to the patient's satisfaction.     [name]

## 2024-04-18 NOTE — Anesthesia Preprocedure Evaluation (Addendum)
 Anesthesia Evaluation  Patient identified by MRN, date of birth, ID band Patient awake    Reviewed: Allergy & Precautions, NPO status , Patient's Chart, lab work & pertinent test results  History of Anesthesia Complications (+) PONV and history of anesthetic complications  Airway Mallampati: I  TM Distance: >3 FB Neck ROM: Full    Dental  (+) Teeth Intact, Dental Advisory Given   Pulmonary    breath sounds clear to auscultation       Cardiovascular hypertension (Norvasc ), Pt. on medications  Rhythm:Regular Rate:Normal  EKG (02/2024): Sinus Bradycardia   Neuro/Psych    GI/Hepatic NAFLD   Endo/Other    Renal/GU Renal disease     Musculoskeletal  (+) Arthritis , Osteoarthritis,    Abdominal   Peds  Hematology   Anesthesia Other Findings Chronic nasal congestion  Reproductive/Obstetrics                              Anesthesia Physical Anesthesia Plan  ASA: 1  Anesthesia Plan: General   Post-op Pain Management:    Induction: Intravenous  PONV Risk Score and Plan: 2 and Ondansetron , Dexamethasone , Treatment may vary due to age or medical condition and Midazolam   Airway Management Planned: Oral ETT  Additional Equipment: None  Intra-op Plan:   Post-operative Plan: Extubation in OR  Informed Consent:      Dental advisory given  Plan Discussed with: CRNA and Surgeon  Anesthesia Plan Comments:          Anesthesia Quick Evaluation

## 2024-04-18 NOTE — Discharge Instructions (Addendum)
 Okay to use Tylenol  and/or Motrin as needed for pain.  If necessary so okay to use the prescription medicine.  No Tylenol  before 2:45pm today.   Post Anesthesia Home Care Instructions  Activity: Get plenty of rest for the remainder of the day. A responsible individual must stay with you for 24 hours following the procedure.  For the next 24 hours, DO NOT: -Drive a car -Advertising copywriter -Drink alcoholic beverages -Take any medication unless instructed by your physician -Make any legal decisions or sign important papers.  Meals: Start with liquid foods such as gelatin or soup. Progress to regular foods as tolerated. Avoid greasy, spicy, heavy foods. If nausea and/or vomiting occur, drink only clear liquids until the nausea and/or vomiting subsides. Call your physician if vomiting continues.  Special Instructions/Symptoms: Your throat may feel dry or sore from the anesthesia or the breathing tube placed in your throat during surgery. If this causes discomfort, gargle with warm salt water. The discomfort should disappear within 24 hours.  If you had a scopolamine patch placed behind your ear for the management of post- operative nausea and/or vomiting:  1. The medication in the patch is effective for 72 hours, after which it should be removed.  Wrap patch in a tissue and discard in the trash. Wash hands thoroughly with soap and water. 2. You may remove the patch earlier than 72 hours if you experience unpleasant side effects which may include dry mouth, dizziness or visual disturbances. 3. Avoid touching the patch. Wash your hands with soap and water after contact with the patch.

## 2024-04-18 NOTE — Anesthesia Procedure Notes (Signed)
 Procedure Name: Intubation Date/Time: 04/18/2024 7:41 AM  Performed by: Buster Catheryn SAUNDERS, CRNAPre-anesthesia Checklist: Patient identified, Emergency Drugs available, Suction available and Patient being monitored Patient Re-evaluated:Patient Re-evaluated prior to induction Oxygen Delivery Method: Circle system utilized Preoxygenation: Pre-oxygenation with 100% oxygen Induction Type: IV induction Ventilation: Mask ventilation without difficulty Laryngoscope Size: Miller and 2 Grade View: Grade II Tube type: Oral Tube size: 7.0 mm Number of attempts: 1 Airway Equipment and Method: Stylet and Oral airway Placement Confirmation: ETT inserted through vocal cords under direct vision, positive ETCO2 and breath sounds checked- equal and bilateral Secured at: 22 cm Tube secured with: Tape Dental Injury: Teeth and Oropharynx as per pre-operative assessment

## 2024-04-18 NOTE — Anesthesia Postprocedure Evaluation (Addendum)
 Anesthesia Post Note  Patient: John Wall  Procedure(s) Performed: SEPTOPLASTY, NOSE, WITH NASAL TURBINATE REDUCTION (Bilateral: Nose)     Patient location during evaluation: PACU Anesthesia Type: General Level of consciousness: awake Pain management: pain level controlled Vital Signs Assessment: post-procedure vital signs reviewed and stable Respiratory status: spontaneous breathing Cardiovascular status: blood pressure returned to baseline Postop Assessment: no apparent nausea or vomiting Anesthetic complications: no   No notable events documented.  Last Vitals:  Vitals:   04/18/24 0915 04/18/24 0932  BP: 126/75 121/75  Pulse: 69 73  Resp: 14 16  Temp:  (!) 36.3 C  SpO2: 100% 97%    Last Pain:  Vitals:   04/18/24 0932  TempSrc: Temporal  PainSc: 0-No pain                 Bexley Mclester T Colhoun

## 2024-04-19 ENCOUNTER — Encounter (HOSPITAL_BASED_OUTPATIENT_CLINIC_OR_DEPARTMENT_OTHER): Payer: Self-pay | Admitting: Otolaryngology

## 2024-04-21 DIAGNOSIS — J342 Deviated nasal septum: Secondary | ICD-10-CM | POA: Diagnosis not present

## 2025-03-01 ENCOUNTER — Encounter: Admitting: Family Medicine
